# Patient Record
Sex: Male | Born: 1990 | Race: White | Hispanic: No | Marital: Married | Smoking: Never smoker
Health system: Southern US, Community
[De-identification: ages and names within clinical notes are randomized; demographics above are authoritative.]

## PROBLEM LIST (undated history)

## (undated) DIAGNOSIS — N200 Calculus of kidney: Secondary | ICD-10-CM

## (undated) DIAGNOSIS — I10 Essential (primary) hypertension: Secondary | ICD-10-CM

## (undated) HISTORY — DX: Essential (primary) hypertension: I10

---
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
## 2012-02-24 ENCOUNTER — Other Ambulatory Visit: Payer: Self-pay | Admitting: Internal Medicine

## 2012-02-24 DIAGNOSIS — R945 Abnormal results of liver function studies: Secondary | ICD-10-CM

## 2012-02-26 ENCOUNTER — Ambulatory Visit
Admission: RE | Admit: 2012-02-26 | Discharge: 2012-02-26 | Disposition: A | Discharge status: Home / Self Care | Payer: 59 | Source: Ambulatory Visit | Attending: Internal Medicine | Admitting: Internal Medicine

## 2012-02-26 DIAGNOSIS — R945 Abnormal results of liver function studies: Secondary | ICD-10-CM

## 2013-11-07 IMAGING — US US ABDOMEN COMPLETE
1 series · 14 of 25 positions shown · non-contrast
Comparison: None.

CLINICAL DATA: Abnormal LFTs

COMPLETE ABDOMINAL ULTRASOUND

[Series 1: us abdomen complete · 14 of 89 slices shown]
[im 1/89]
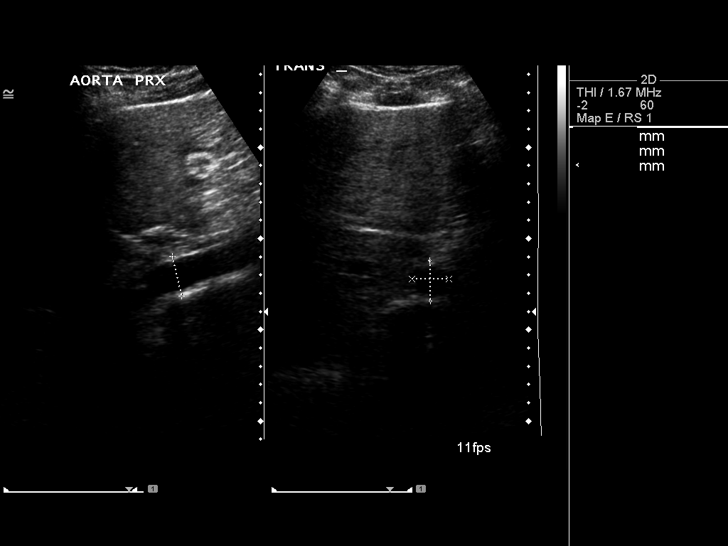
[im 8/89]
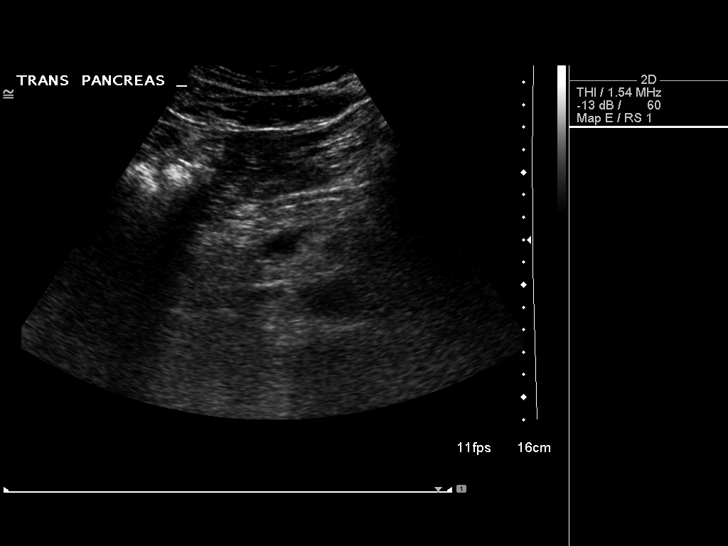
[im 15/89]
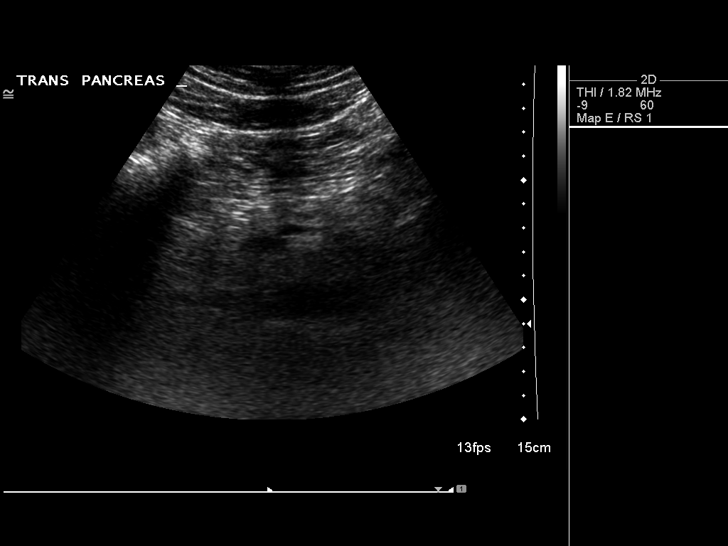
[im 23/89]
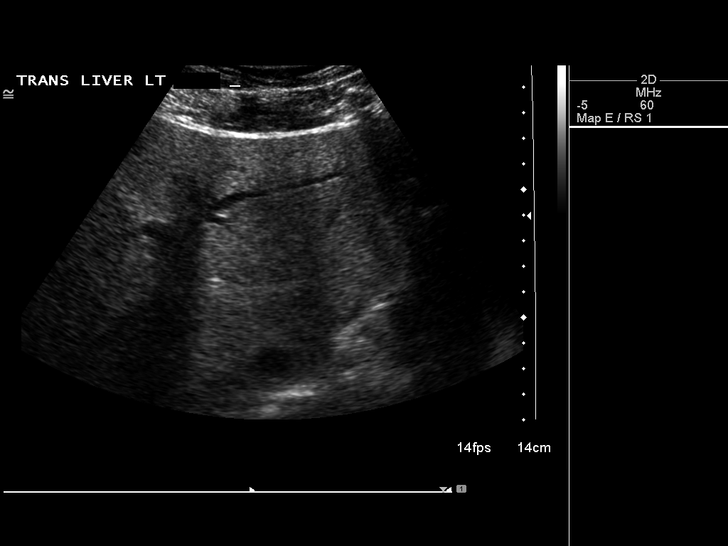
[im 30/89]
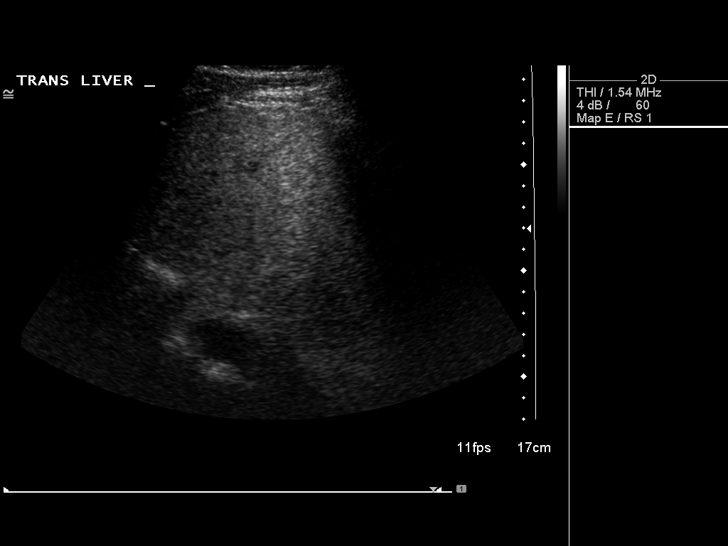
[im 34/89]
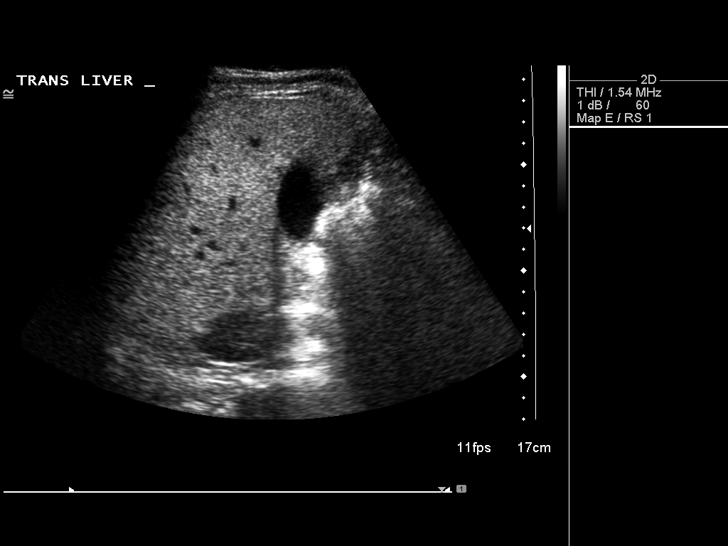
[im 41/89]
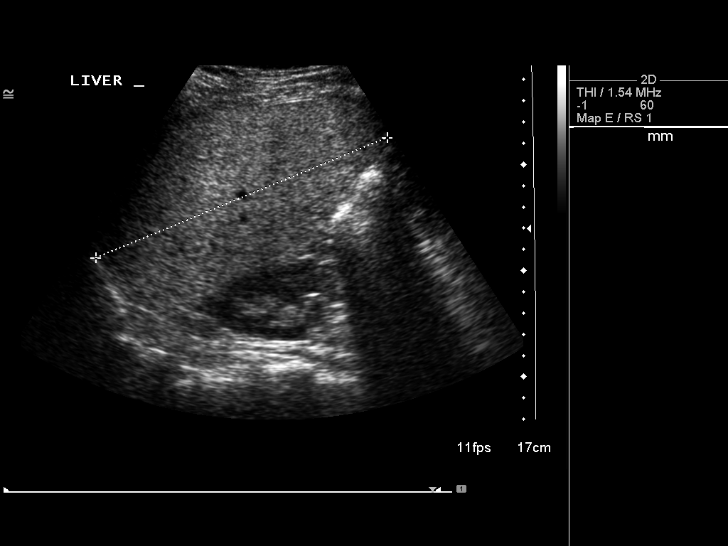
[im 48/89]
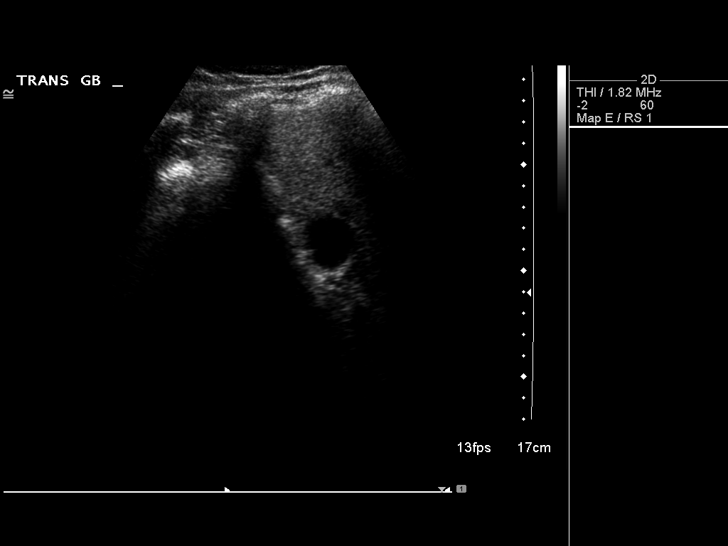
[im 56/89]
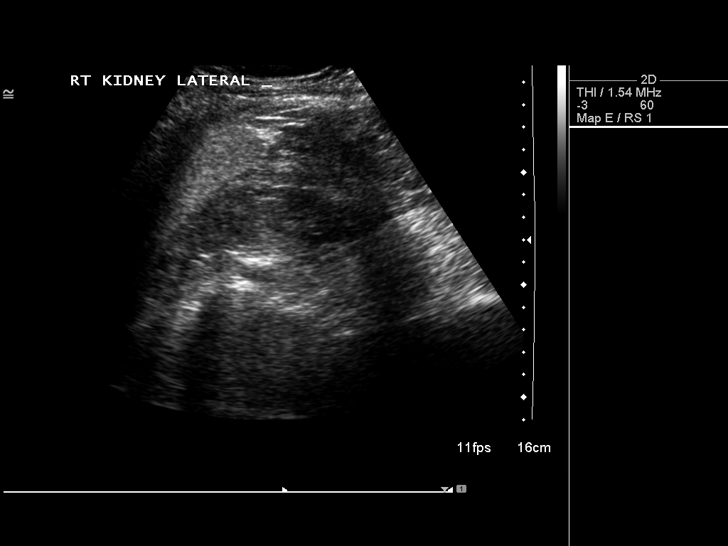
[im 59/89]
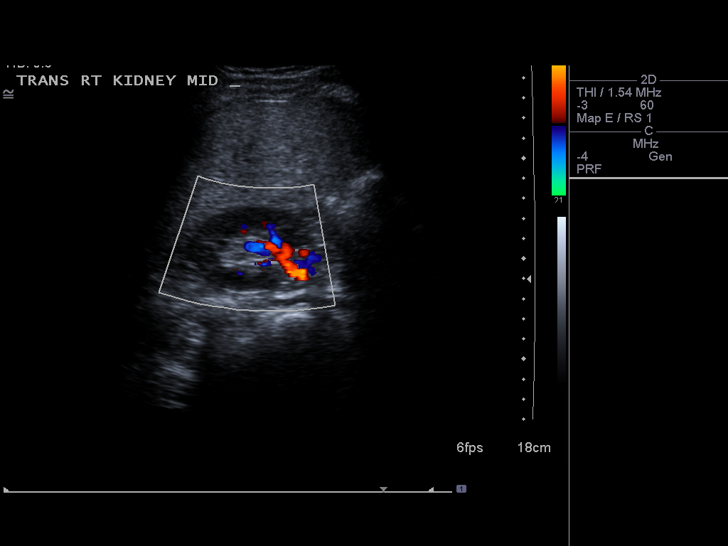
[im 67/89]
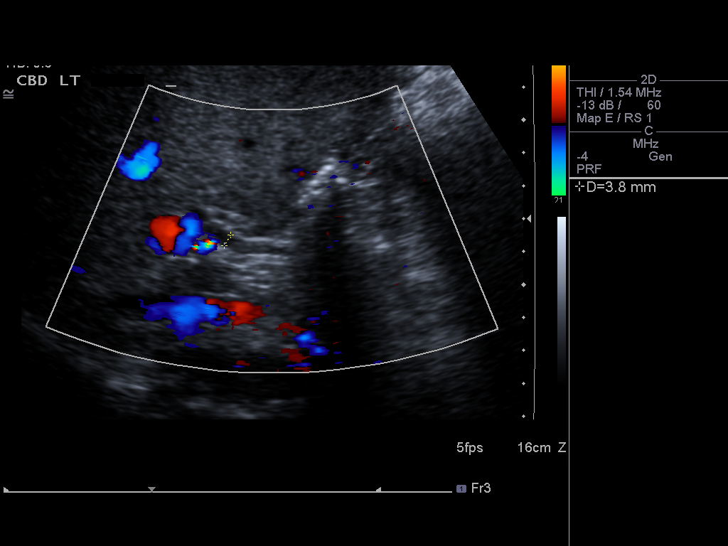
[im 74/89]
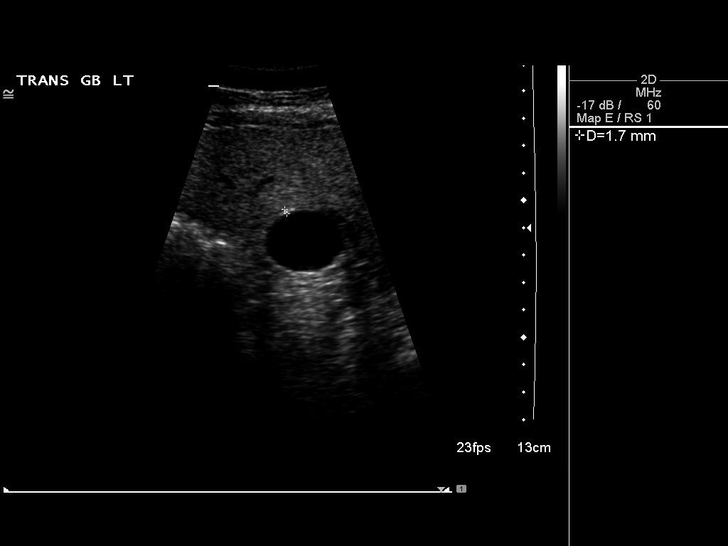
[im 81/89]
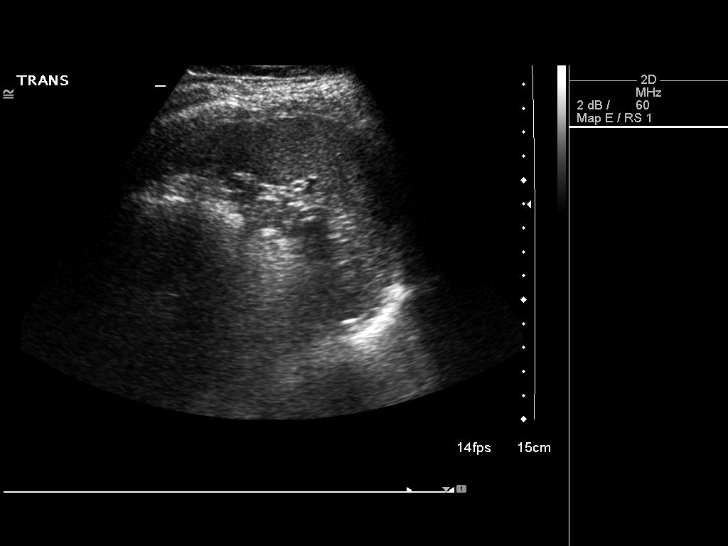
[im 89/89]
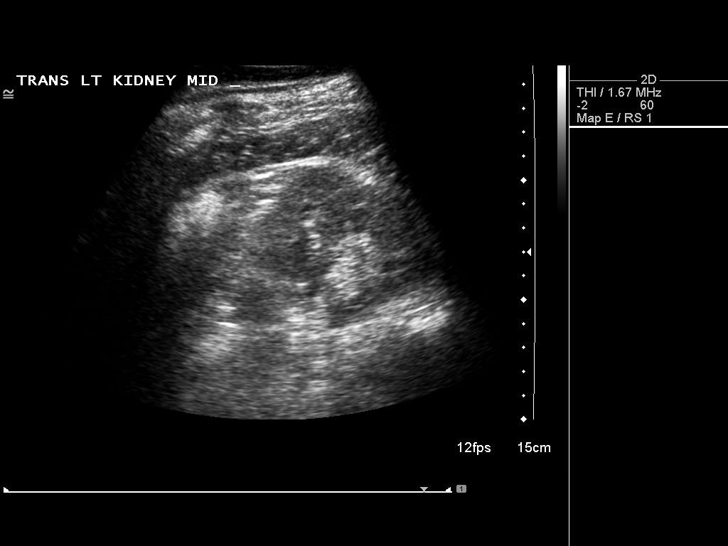

[14 of 25 positions shown; findings below may reference images not displayed]

FINDINGS: Gallbladder:  No shadowing gallstones or echogenic sludge.  No
gallbladder wall thickening or pericholecystic fluid.  Negative
sonographic Murphy's sign according to the ultrasound technologist.

Common bile duct:  3.8 mm diameter, unremarkable

Liver:  Echogenic parenchyma without focal lesion or intrahepatic
biliary ductal dilatation.  Antegrade portal vein flow.

IVC:  Appears normal.

Pancreas:  Unremarkable, portions obscured by overlying bowel gas.

Spleen:  10 cm craniocaudal length, unremarkable.

Right Kidney:  9.8 cm.  10 x 11 mm parapelvic cyst in the upper
pole.  No solid renal lesion or hydronephrosis.

Left Kidney:  10.9 cm. No hydronephrosis.  Well-preserved cortex.
Normal size and parenchymal echotexture without focal
abnormalities.

Abdominal aorta:  No aneurysm identified.
IMPRESSION: 1.  Normal gallbladder.
2. Echogenic liver parenchyma, a nonspecific finding often
associated with fatty infiltration.

## 2015-01-25 ENCOUNTER — Encounter (HOSPITAL_COMMUNITY): Payer: Self-pay | Admitting: *Deleted

## 2015-01-25 ENCOUNTER — Emergency Department (INDEPENDENT_AMBULATORY_CARE_PROVIDER_SITE_OTHER): Payer: 59

## 2015-01-25 ENCOUNTER — Emergency Department (INDEPENDENT_AMBULATORY_CARE_PROVIDER_SITE_OTHER)
Admission: EM | Admit: 2015-01-25 | Discharge: 2015-01-25 | Disposition: A | Discharge status: Home / Self Care | Payer: Self-pay | Source: Home / Self Care | Attending: Family Medicine | Admitting: Family Medicine

## 2015-01-25 DIAGNOSIS — S60222A Contusion of left hand, initial encounter: Secondary | ICD-10-CM
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
## 2015-01-25 NOTE — Discharge Instructions (Signed)
Ice pack for 2-3 days for soreness with advil as needed, return if any problems.
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
## 2020-03-17 ENCOUNTER — Other Ambulatory Visit: Payer: Self-pay

## 2020-03-17 ENCOUNTER — Emergency Department (HOSPITAL_COMMUNITY)
Admission: EM | Admit: 2020-03-17 | Discharge: 2020-03-18 | Disposition: A | Discharge status: Home / Self Care | Payer: BC Managed Care – PPO | Attending: Emergency Medicine | Admitting: Emergency Medicine

## 2020-03-17 ENCOUNTER — Encounter (HOSPITAL_COMMUNITY): Payer: Self-pay | Admitting: Emergency Medicine

## 2020-03-17 DIAGNOSIS — R059 Cough, unspecified: Secondary | ICD-10-CM | POA: Diagnosis present

## 2020-03-17 DIAGNOSIS — U071 COVID-19: Secondary | ICD-10-CM | POA: Diagnosis not present

## 2020-03-17 LAB — RESP PANEL BY RT-PCR (FLU A&B, COVID) ARPGX2
Influenza A by PCR: NEGATIVE
Influenza B by PCR: NEGATIVE
SARS Coronavirus 2 by RT PCR: POSITIVE — AB

## 2020-03-17 NOTE — ED Triage Notes (Addendum)
Pt reports cough, runny nose, and fever that started yesterday.  His job sent him for a COVID test.

## 2020-03-17 NOTE — ED Notes (Signed)
Pt updated on wait for treatment room and moved to COVID + waiting area.

## 2020-03-18 NOTE — ED Provider Notes (Signed)
[facility] Provider Note   CSN: [id number] Arrival date & time: 03/17/20  1125     History Chief Complaint  Patient presents with  . Cough    [name] is a 30 y.o. male.  The history is provided by the patient and medical records.    30 y.o. M here with 24 hours of cough, runny nose, and fever.  Denies sick contacts or known covid exposures.  He has been eating/drinking well, denies vomiting/diarrhea.  Family members without current symptoms.  States work sent him here for covid testing prior to returning.  Fully vaccinated for covid-19.  History reviewed. No pertinent past medical history.  There are no problems to display for this patient.   History reviewed. No pertinent surgical history.     No family history on file.  Social History   Substance Use Topics  . Alcohol use: No    Home Medications Prior to Admission medications   Not on File    Allergies    Patient has no allergy information on record.  Review of Systems   Review of Systems  HENT: Positive for congestion and rhinorrhea.   Respiratory: Positive for cough.   All other systems reviewed and are negative.   Physical Exam Updated Vital Signs BP 108/72 (BP Location: Right Arm)   Pulse 96   Temp 99.9 F (37.7 C) (Oral)   Resp 20   SpO2 99%   Physical Exam Vitals and nursing note reviewed.  Constitutional:      Appearance: He is well-developed and well-nourished.  HENT:     Head: Normocephalic and atraumatic.     Mouth/Throat:     Mouth: Oropharynx is clear and moist.  Eyes:     Extraocular Movements: EOM normal.     Conjunctiva/sclera: Conjunctivae normal.     Pupils: Pupils are equal, round, and reactive to light.  Cardiovascular:     Rate and Rhythm: Normal rate and regular rhythm.     Heart sounds: Normal heart sounds.  Pulmonary:     Effort: Pulmonary effort is normal.     Breath sounds: Normal breath sounds.     Comments: Dry  cough observed, NAD, speaking in full sentences with fluid conversation, O2 sats stable on RA Abdominal:     General: Bowel sounds are normal.     Palpations: Abdomen is soft.  Musculoskeletal:        General: Normal range of motion.     Cervical back: Normal range of motion.  Skin:    General: Skin is warm and dry.  Neurological:     Mental Status: He is alert and oriented to person, place, and time.  Psychiatric:        Mood and Affect: Mood and affect normal.     ED Results / Procedures / Treatments   Labs (all labs ordered are listed, but only abnormal results are displayed) Labs Reviewed  RESP PANEL BY RT-PCR (FLU A&B, COVID) ARPGX2 - Abnormal; Notable for the following components:      Result Value   SARS Coronavirus 2 by RT PCR POSITIVE (*)    All other components within normal limits    EKG None  Radiology No results found.  Procedures Procedures (including critical care time)  Medications Ordered in ED Medications - No data to display  ED Course  I have reviewed the triage vital signs and the nursing notes.  Pertinent labs & imaging results that were available during  my care of the patient were reviewed by me and considered in my medical decision making (see chart for details).    MDM Rules/Calculators/A&P  30 y.o. M here with URI symptoms x24 hours.  No sick contacts or covid exposures.  He is fully vaccinated.  He is afebrile, non-toxic, symptoms appear mild.  No acute respiratory distress noted, vitals stable on RA.  PCR is positive.  Informed of results, states he has been told to stay out of work for 1 week by HR, work note provided along with copy of positive result.  Encouraged rest, oral hydration, tylenol for fever.  Information for local testing centers given for family testing if desired.  Return here for new concerns.  Final Clinical Impression(s) / ED Diagnoses Final diagnoses:  COVID-19    Rx / DC Orders ED Discharge Orders    None        Garlon Hatchet, PA-C 03/18/20 0351    Palumbo, April, MD 03/18/20 [id number]

## 2020-03-18 NOTE — Discharge Instructions (Signed)
Covid test today was positive. Try to quarantine away from others best you can. If desiring testing for family, can use the [facility]. Appointments can be made at [email or web address] or by calling [phone]   Rest, stay hydrated, tylenol as needed for fever.

## 2020-06-11 ENCOUNTER — Emergency Department (HOSPITAL_COMMUNITY)
Admission: EM | Admit: 2020-06-11 | Discharge: 2020-06-12 | Disposition: A | Discharge status: Home / Self Care | Payer: BC Managed Care – PPO | Attending: Emergency Medicine | Admitting: Emergency Medicine

## 2020-06-11 ENCOUNTER — Other Ambulatory Visit: Payer: Self-pay

## 2020-06-11 ENCOUNTER — Encounter (HOSPITAL_COMMUNITY): Payer: Self-pay

## 2020-06-11 ENCOUNTER — Emergency Department (HOSPITAL_COMMUNITY): Payer: BC Managed Care – PPO

## 2020-06-11 DIAGNOSIS — R519 Headache, unspecified: Secondary | ICD-10-CM | POA: Insufficient documentation

## 2020-06-11 DIAGNOSIS — R42 Dizziness and giddiness: Secondary | ICD-10-CM | POA: Insufficient documentation

## 2020-06-11 DIAGNOSIS — R112 Nausea with vomiting, unspecified: Secondary | ICD-10-CM | POA: Diagnosis not present

## 2020-06-11 MED ORDER — MECLIZINE HCL 25 MG PO TABS
25.0000 mg | ORAL_TABLET | Freq: Once | ORAL | Status: AC
  Administered 2020-06-11: 25 mg via ORAL
  Filled 2020-06-11: qty 1

## 2020-06-11 NOTE — ED Provider Notes (Addendum)
MSE was initiated and I personally evaluated the patient and placed orders (if any) at  10:16 PM on June 11, 2020.  Patient presents with gradual onset HA, circumferential, and room spinning dizziness that started at the same time. Go home, ended up falling secondary to dizziness/off balance - no LOC. Naurea with vomiting. Reports fever at home, Tmax 102.3. Has not taken anything for fever - 99.3 on arrival. No history of HA's or vertigo.   No PMH. No medications.   Exam +lateral nystagmus, no other neuro findings. No meningeal signs     The patient appears stable so that the remainder of the MSE may be completed by another provider.   Elpidio Anis, PA-C 06/11/20 2222    Elpidio Anis, PA-C 06/11/20 2224    Alvira Monday, MD 06/12/20 1210

## 2020-06-11 NOTE — ED Triage Notes (Addendum)
Woke up today with dizziness (the room was spinning) and headache with nausea and vomiting. Fever at home with 102.3. neck pain upon movement.

## 2020-06-12 MED ORDER — MECLIZINE HCL 25 MG PO TABS: 25.0000 mg | ORAL_TABLET | Freq: Three times a day (TID) | ORAL | 0 refills | Status: DC | PRN

## 2020-06-12 MED ORDER — ONDANSETRON 4 MG PO TBDP: 4.0000 mg | ORAL_TABLET | Freq: Three times a day (TID) | ORAL | 0 refills | Status: DC | PRN

## 2020-06-12 NOTE — ED Provider Notes (Signed)
[facility] EMERGENCY DEPARTMENT Provider Note   CSN: [id number] Arrival date & time: 06/11/20  2154     History Chief Complaint  Patient presents with  . Dizziness  . Headache    [facility] is a 30 y.o. male.  Patient to ED for evaluation of gradual onset headache associated with room spinning dizziness that started yesterday evening (4/5). He reports having a fever at home by temporal thermometer of 102.3. He developed nausea with vomiting. The dizziness is worse with movement or change in position. No previous history of headaches or vertigo, but he reports his father had vertigo with debilitating symptoms. No head injury, visual changes, SOB, weakness.  The history is provided by the patient. A language interpreter was used.  Dizziness Associated symptoms: headaches, nausea and vomiting   Associated symptoms: no chest pain, no shortness of breath and no weakness   Headache Associated symptoms: dizziness, fever (See HPI.), nausea and vomiting   Associated symptoms: no abdominal pain, no congestion, no cough, no myalgias, no numbness, no sore throat and no weakness        History reviewed. No pertinent past medical history.  There are no problems to display for this patient.   History reviewed. No pertinent surgical history.     No family history on file.  Social History   Tobacco Use  . Smoking status: Never Smoker  . Smokeless tobacco: Never Used  Substance Use Topics  . Alcohol use: No  . Drug use: Never    Home Medications Prior to Admission medications   Not on File    Allergies    Patient has no known allergies.  Review of Systems   Review of Systems  Constitutional: Positive for fever (See HPI.). Negative for chills.  HENT: Negative.  Negative for congestion and sore throat.   Eyes: Negative for visual disturbance.  Respiratory: Negative.  Negative for cough and shortness of breath.   Cardiovascular: Negative.  Negative  for chest pain.  Gastrointestinal: Positive for nausea and vomiting. Negative for abdominal pain.  Musculoskeletal: Negative.  Negative for myalgias.  Skin: Negative.  Negative for color change.  Neurological: Positive for dizziness and headaches. Negative for syncope, weakness and numbness.    Physical Exam Updated Vital Signs BP 126/85   Pulse 95   Temp 98.8 F (37.1 C) (Oral)   Resp 16   Ht 5\' 5"  (1.651 m)   Wt 88.5 kg   SpO2 98%   BMI 32.45 kg/m   Physical Exam Constitutional:      Appearance: He is well-developed.  HENT:     Head: Normocephalic.  Cardiovascular:     Rate and Rhythm: Normal rate.  Pulmonary:     Effort: Pulmonary effort is normal.  Abdominal:     Palpations: Abdomen is soft.     Tenderness: There is no abdominal tenderness. There is no rebound.  Musculoskeletal:        General: Normal range of motion.     Cervical back: Normal range of motion and neck supple.  Skin:    General: Skin is warm and dry.  Neurological:     Mental Status: He is alert.     GCS: GCS eye subscore is 4. GCS verbal subscore is 5. GCS motor subscore is 6.     Cranial Nerves: No dysarthria.     Sensory: No sensory deficit.     Motor: No weakness.     Coordination: Coordination normal.  Comments: CN's 3-12 grossly intact. Speech is clear and focused. No facial asymmetry. No lateralizing weakness. No deficits of coordination. There is lateral nystagmus present.    Psychiatric:        Mood and Affect: Mood normal.     ED Results / Procedures / Treatments   Labs (all labs ordered are listed, but only abnormal results are displayed) Labs Reviewed - No data to display  EKG None  Radiology CT Head Wo Contrast  Result Date: 06/11/2020 CLINICAL DATA:  Dizziness EXAM: CT HEAD WITHOUT CONTRAST TECHNIQUE: Contiguous axial images were obtained from the base of the skull through the vertex without intravenous contrast. COMPARISON:  None. FINDINGS: Brain: No evidence of acute  territorial infarction, hemorrhage, hydrocephalus,extra-axial collection or mass lesion/mass effect. Normal gray-white differentiation. Ventricles are normal in size and contour. Vascular: No hyperdense vessel or unexpected calcification. Skull: The skull is intact. No fracture or focal lesion identified. Sinuses/Orbits: The visualized paranasal sinuses and mastoid air cells are clear. The orbits and globes intact. Other: None IMPRESSION: No acute intracranial abnormality. Electronically Signed   By: Jonna Clark M.D.   On: 06/11/2020 23:06    Procedures Procedures   Medications Ordered in ED Medications  meclizine (ANTIVERT) tablet 25 mg (25 mg Oral Given 06/11/20 2232)    ED Course  I have reviewed the triage vital signs and the nursing notes.  Pertinent labs & imaging results that were available during my care of the patient were reviewed by me and considered in my medical decision making (see chart for details).    MDM Rules/Calculators/A&P                          Patient to ED with ss/sxs as per HPI. 30 yo otherwise healthy patient.   Symptoms and exam c/w vertigo. No history of same, no history of HA's. CT head ordered and is negative.   He reports improvement with Meclizine provided in ED. Still dizzy, no headache, but dizziness improved. He is considered appropriate for discharge home. Return precautions discussed.   Final Clinical Impression(s) / ED Diagnoses Final diagnoses:  None   1. Vertigo  Rx / DC Orders ED Discharge Orders    None       Elpidio Anis, PA-C 06/12/20 0106    Geoffery Lyons, MD 06/12/20 [phone]

## 2020-06-12 NOTE — Discharge Instructions (Signed)
Take the medications as prescribed. Your dizziness should improve over the course of the next several days.   If you become worse, start having a severe headache, uncontrolled vomiting, confirmed high fever, please return to the emergency department for further evaluation.

## 2020-11-12 ENCOUNTER — Other Ambulatory Visit: Payer: Self-pay

## 2020-11-12 ENCOUNTER — Ambulatory Visit
Admission: EM | Admit: 2020-11-12 | Discharge: 2020-11-12 | Disposition: A | Discharge status: Home / Self Care | Payer: BC Managed Care – PPO | Attending: Internal Medicine | Admitting: Internal Medicine

## 2020-11-12 DIAGNOSIS — H109 Unspecified conjunctivitis: Secondary | ICD-10-CM

## 2020-11-12 DIAGNOSIS — H5711 Ocular pain, right eye: Secondary | ICD-10-CM

## 2020-11-12 MED ORDER — OFLOXACIN 0.3 % OP SOLN: 1.0000 [drp] | Freq: Four times a day (QID) | OPHTHALMIC | 0 refills | Status: AC

## 2020-11-12 NOTE — Discharge Instructions (Addendum)
You have been prescribed ofloxacin eyedrops to treat eye infection.  Please follow-up with eye doctor if symptoms do not improve in the next 48 hours or if symptoms worsen.

## 2020-11-12 NOTE — ED Triage Notes (Signed)
4 day h/o right eye redness and drainage. Pt reports sxs appeared to be getting better then began to increase without cause. Describes pain as "sand in it". Has been using OTC pink eye drops without a decrease in sxs.

## 2020-11-12 NOTE — ED Provider Notes (Signed)
[facility]    CSN: [id number] Arrival date & time: 11/12/20  0817      History   Chief Complaint Chief Complaint  Patient presents with   eye redness    right    HPI [facility] is a 30 y.o. male.   Patient presents with 4-day history of right eye redness, swelling, purulent drainage, itchiness.  Denies getting anything in his eye or any injury to the eye.  Patient does wear contacts but has not been wearing them since symptoms started.  States that it feels like he has "sand in it".  Has been using over-the-counter pinkeye drops with no resolution in symptoms.  Does have some mild blurry vision.  Denies any fevers or any recent upper respiratory symptoms.    History reviewed. No pertinent past medical history.  There are no problems to display for this patient.   History reviewed. No pertinent surgical history.     Home Medications    Prior to Admission medications   Medication Sig Start Date End Date Taking? Authorizing Provider  ofloxacin (OCUFLOX) 0.3 % ophthalmic solution Place 1 drop into the right eye 4 (four) times daily for 7 days. 11/12/20 11/19/20 Yes Lance Muss, FNP  meclizine (ANTIVERT) 25 MG tablet Take 1 tablet (25 mg total) by mouth 3 (three) times daily as needed for dizziness. 06/12/20   Elpidio Anis, PA-C  ondansetron (ZOFRAN ODT) 4 MG disintegrating tablet Take 1 tablet (4 mg total) by mouth every 8 (eight) hours as needed for nausea or vomiting. 06/12/20   Elpidio Anis, PA-C    Family History History reviewed. No pertinent family history.  Social History Social History   Tobacco Use   Smoking status: Never   Smokeless tobacco: Never  Substance Use Topics   Alcohol use: No   Drug use: Never     Allergies   Patient has no known allergies.   Review of Systems Review of Systems Per HPI  Physical Exam Triage Vital Signs ED Triage Vitals  Enc Vitals Group     BP 11/12/20 0827 123/85     Pulse Rate 11/12/20 0827  88     Resp 11/12/20 0827 18     Temp 11/12/20 0827 98.2 F (36.8 C)     Temp Source 11/12/20 0827 Oral     SpO2 11/12/20 0827 97 %     Weight --      Height --      Head Circumference --      Peak Flow --      Pain Score 11/12/20 0829 5     Pain Loc --      Pain Edu? --      Excl. in GC? --    No data found.  Updated Vital Signs BP 123/85 (BP Location: Right Arm)   Pulse 88   Temp 98.2 F (36.8 C) (Oral)   Resp 18   SpO2 97%   Visual Acuity Right Eye Distance: 20/20 Left Eye Distance: 20/30 Bilateral Distance: 20/15 (pt wearing glasses during exam)  Right Eye Near:   Left Eye Near:    Bilateral Near:     Physical Exam Constitutional:      Appearance: Normal appearance.  HENT:     Head: Normocephalic and atraumatic.     Right Ear: Tympanic membrane and ear canal normal.     Left Ear: Tympanic membrane and ear canal normal.     Nose: Nose normal.  Mouth/Throat:     Lips: Pink.     Mouth: Mucous membranes are moist.     Pharynx: Oropharynx is clear. No posterior oropharyngeal erythema.  Eyes:     General: Lids are normal. Lids are everted, no foreign bodies appreciated.        Right eye: Discharge present. No foreign body or hordeolum.     Extraocular Movements: Extraocular movements intact.     Conjunctiva/sclera:     Right eye: Right conjunctiva is injected. Exudate present. No chemosis or hemorrhage.    Comments: Scleral redness present to right eye.  Pulmonary:     Effort: Pulmonary effort is normal.  Neurological:     General: No focal deficit present.     Mental Status: He is alert and oriented to person, place, and time. Mental status is at baseline.  Psychiatric:        Mood and Affect: Mood normal.        Behavior: Behavior normal.        Thought Content: Thought content normal.        Judgment: Judgment normal.     UC Treatments / Results  Labs (all labs ordered are listed, but only abnormal results are displayed) Labs Reviewed - No data  to display  EKG   Radiology No results found.  Procedures Procedures (including critical care time)  Medications Ordered in UC Medications - No data to display  Initial Impression / Assessment and Plan / UC Course  I have reviewed the triage vital signs and the nursing notes.  Pertinent labs & imaging results that were available during my care of the patient were reviewed by me and considered in my medical decision making (see chart for details).     Physical exam is consistent with right bacterial conjunctivitis.  Will treat with ofloxacin due to patient being a contact lens wear.  Visual acuity fairly normal.  No red flags at this time.  Advised patient to follow-up with ophthalmologist if no improvement in symptoms in the next 24 to 48 hours due to patient being a contact lens wearer.  Patient voiced understanding and was agreeable with plan.Discussed strict return precautions. Patient verbalized understanding and is agreeable with plan.  Final Clinical Impressions(s) / UC Diagnoses   Final diagnoses:  Bacterial conjunctivitis of right eye  Discomfort of right eye     Discharge Instructions      You have been prescribed ofloxacin eyedrops to treat eye infection.  Please follow-up with eye doctor if symptoms do not improve in the next 48 hours or if symptoms worsen.     ED Prescriptions     Medication Sig Dispense Auth. Provider   ofloxacin (OCUFLOX) 0.3 % ophthalmic solution Place 1 drop into the right eye 4 (four) times daily for 7 days. 1.4 mL Lance Muss, FNP      PDMP not reviewed this encounter.   Lance Muss, FNP 11/12/20 [phone]

## 2023-04-20 NOTE — Progress Notes (Unsigned)
    New patient visit   Patient: Ruben Sweeney   DOB: Oct 29, 1990   33 y.o. Male  MRN: [id number] Visit Date: 04/21/2023  Today's healthcare provider: Alfredia Ferguson, PA-C   No chief complaint on file.  Subjective    HONORIO DEVOL is a 33 y.o. male who presents today as a new patient to establish care.   ***  No past medical history on file. No past surgical history on file. No family status information on file.   No family history on file. Social History   Socioeconomic History   Marital status: Married    Spouse name: Not on file   Number of children: Not on file   Years of education: Not on file   Highest education level: Not on file  Occupational History   Not on file  Tobacco Use   Smoking status: Never   Smokeless tobacco: Never  Substance and Sexual Activity   Alcohol use: No   Drug use: Never   Sexual activity: Not on file  Other Topics Concern   Not on file  Social History Narrative   Not on file   Social Drivers of Health   Financial Resource Strain: Not on file  Food Insecurity: Not on file  Transportation Needs: Not on file  Physical Activity: Not on file  Stress: Not on file  Social Connections: Not on file   Outpatient Medications Prior to Visit  Medication Sig   meclizine (ANTIVERT) 25 MG tablet Take 1 tablet (25 mg total) by mouth 3 (three) times daily as needed for dizziness.   ondansetron (ZOFRAN ODT) 4 MG disintegrating tablet Take 1 tablet (4 mg total) by mouth every 8 (eight) hours as needed for nausea or vomiting.   No facility-administered medications prior to visit.   No Known Allergies   There is no immunization history on file for this patient.  Health Maintenance  Topic Date Due   HIV Screening  Never done   Hepatitis C Screening  Never done   DTaP/Tdap/Td (1 - Tdap) Never done   INFLUENZA VACCINE  Never done   COVID-19 Vaccine (1 - 2024-25 season) Never done   HPV VACCINES  Aged Out    Patient Care  Team: Patient, No Pcp Per as PCP - General (General Practice)  Review of Systems  {Insert previous labs (optional):23779} {See past labs  Heme  Chem  Endocrine  Serology  Results Review (optional):1}   Objective    There were no vitals taken for this visit. {Insert last BP/Wt (optional):23777}{See vitals history (optional):1}   Physical Exam ***  Depression Screen     No data to display         No results found for any visits on 04/21/23.  Assessment & Plan     There are no diagnoses linked to this encounter.   No follow-ups on file.      Alfredia Ferguson, PA-C  [facility] Primary [facility] at [facility] [phone] (phone) [phone] (fax)  [facility]

## 2023-04-21 ENCOUNTER — Ambulatory Visit: Payer: Medicaid Other | Admitting: Physician Assistant

## 2023-04-21 ENCOUNTER — Other Ambulatory Visit (HOSPITAL_BASED_OUTPATIENT_CLINIC_OR_DEPARTMENT_OTHER): Payer: Self-pay

## 2023-04-21 ENCOUNTER — Encounter: Payer: Self-pay | Admitting: Physician Assistant

## 2023-04-21 VITALS — BP 127/83 | HR 66 | Temp 98.1°F | Ht 65.5 in | Wt 199.4 lb

## 2023-04-21 DIAGNOSIS — Z13220 Encounter for screening for lipoid disorders: Secondary | ICD-10-CM

## 2023-04-21 DIAGNOSIS — R1013 Epigastric pain: Secondary | ICD-10-CM

## 2023-04-21 DIAGNOSIS — Z1159 Encounter for screening for other viral diseases: Secondary | ICD-10-CM | POA: Diagnosis not present

## 2023-04-21 DIAGNOSIS — K219 Gastro-esophageal reflux disease without esophagitis: Secondary | ICD-10-CM | POA: Diagnosis not present

## 2023-04-21 DIAGNOSIS — R519 Headache, unspecified: Secondary | ICD-10-CM | POA: Diagnosis not present

## 2023-04-21 DIAGNOSIS — G8929 Other chronic pain: Secondary | ICD-10-CM | POA: Diagnosis not present

## 2023-04-21 DIAGNOSIS — Z23 Encounter for immunization: Secondary | ICD-10-CM

## 2023-04-21 DIAGNOSIS — R200 Anesthesia of skin: Secondary | ICD-10-CM | POA: Diagnosis not present

## 2023-04-21 DIAGNOSIS — Z114 Encounter for screening for human immunodeficiency virus [HIV]: Secondary | ICD-10-CM

## 2023-04-21 LAB — VITAMIN B12: Vitamin B-12: 131 pg/mL — ABNORMAL LOW (ref 211–911)

## 2023-04-21 LAB — COMPREHENSIVE METABOLIC PANEL
ALT: 61 U/L — ABNORMAL HIGH (ref 0–53)
AST: 25 U/L (ref 0–37)
Albumin: 4.7 g/dL (ref 3.5–5.2)
Alkaline Phosphatase: 71 U/L (ref 39–117)
BUN: 12 mg/dL (ref 6–23)
CO2: 29 meq/L (ref 19–32)
Calcium: 9.6 mg/dL (ref 8.4–10.5)
Chloride: 102 meq/L (ref 96–112)
Creatinine, Ser: 0.82 mg/dL (ref 0.40–1.50)
GFR: 115.16 mL/min (ref >60.00)
Glucose, Bld: 90 mg/dL (ref 70–99)
Potassium: 4.5 meq/L (ref 3.5–5.1)
Sodium: 139 meq/L (ref 135–145)
Total Bilirubin: 0.8 mg/dL (ref 0.2–1.2)
Total Protein: 7.4 g/dL (ref 6.0–8.3)

## 2023-04-21 LAB — LIPID PANEL
Cholesterol: 175 mg/dL (ref 0–200)
HDL: 36.4 mg/dL — ABNORMAL LOW (ref >39.00)
LDL Cholesterol: 100 mg/dL — ABNORMAL HIGH (ref 0–99)
NonHDL: 138.14
Total CHOL/HDL Ratio: 5
Triglycerides: 193 mg/dL — ABNORMAL HIGH (ref 0.0–149.0)
VLDL: 38.6 mg/dL (ref 0.0–40.0)

## 2023-04-21 LAB — CBC WITH DIFFERENTIAL/PLATELET
Basophils Absolute: 0 10*3/uL (ref 0.0–0.1)
Basophils Relative: 0.4 % (ref 0.0–3.0)
Eosinophils Absolute: 0.1 10*3/uL (ref 0.0–0.7)
Eosinophils Relative: 0.8 % (ref 0.0–5.0)
HCT: 46.4 % (ref 39.0–52.0)
Hemoglobin: 15.8 g/dL (ref 13.0–17.0)
Lymphocytes Relative: 43.4 % (ref 12.0–46.0)
Lymphs Abs: 3.8 10*3/uL (ref 0.7–4.0)
MCHC: 33.9 g/dL (ref 30.0–36.0)
MCV: 95.4 fL (ref 78.0–100.0)
Monocytes Absolute: 0.5 10*3/uL (ref 0.1–1.0)
Monocytes Relative: 5.3 % (ref 3.0–12.0)
Neutro Abs: 4.3 10*3/uL (ref 1.4–7.7)
Neutrophils Relative %: 50.1 % (ref 43.0–77.0)
Platelets: 280 10*3/uL (ref 150.0–400.0)
RBC: 4.86 Mil/uL (ref 4.22–5.81)
RDW: 13.4 % (ref 11.5–15.5)
WBC: 8.7 10*3/uL (ref 4.0–10.5)

## 2023-04-21 LAB — HEMOGLOBIN A1C: Hgb A1c MFr Bld: 5.6 % (ref 4.6–6.5)

## 2023-04-21 MED ORDER — PANTOPRAZOLE SODIUM 40 MG PO TBEC
40.0000 mg | DELAYED_RELEASE_TABLET | Freq: Every day | ORAL | 1 refills | Status: DC
  Filled 2023-04-21: qty 90, 90d supply, fill #0
  Filled 2023-07-18: qty 90, 90d supply, fill #1

## 2023-04-21 NOTE — Assessment & Plan Note (Signed)
Will check labs, vit b12 No lower extremity numbness If no etiology becomes apparent, given headaches and light sensitivity, consider brain MRI or neuro ref

## 2023-04-21 NOTE — Assessment & Plan Note (Signed)
-   Order H. pylori breath test - Prescribe pantoprazole 40 mg daily in AM - Order gallbladder ultrasound - Advise continued use of Tums and Pepcid as needed

## 2023-04-21 NOTE — Assessment & Plan Note (Signed)
Right-sided headaches occurring once a week, associated with photophobia and tinnitus. Symptoms relieved by wearing sunglasses. Discussed identifying triggers. Will check routine labs. Suggested following up with optometry.

## 2023-04-22 ENCOUNTER — Ambulatory Visit (HOSPITAL_BASED_OUTPATIENT_CLINIC_OR_DEPARTMENT_OTHER)
Admission: RE | Admit: 2023-04-22 | Discharge: 2023-04-22 | Disposition: A | Discharge status: Home / Self Care | Payer: Medicaid Other | Source: Ambulatory Visit | Attending: Physician Assistant | Admitting: Physician Assistant

## 2023-04-22 ENCOUNTER — Encounter: Payer: Self-pay | Admitting: Physician Assistant

## 2023-04-22 ENCOUNTER — Other Ambulatory Visit: Payer: Self-pay | Admitting: Physician Assistant

## 2023-04-22 DIAGNOSIS — R1013 Epigastric pain: Secondary | ICD-10-CM | POA: Diagnosis not present

## 2023-04-22 DIAGNOSIS — K219 Gastro-esophageal reflux disease without esophagitis: Secondary | ICD-10-CM | POA: Diagnosis not present

## 2023-04-22 DIAGNOSIS — R748 Abnormal levels of other serum enzymes: Secondary | ICD-10-CM

## 2023-04-22 DIAGNOSIS — E538 Deficiency of other specified B group vitamins: Secondary | ICD-10-CM

## 2023-04-22 DIAGNOSIS — K760 Fatty (change of) liver, not elsewhere classified: Secondary | ICD-10-CM | POA: Insufficient documentation

## 2023-04-22 LAB — HIV ANTIBODY (ROUTINE TESTING W REFLEX): HIV 1&2 Ab, 4th Generation: NONREACTIVE

## 2023-04-22 LAB — HEPATITIS C ANTIBODY: Hepatitis C Ab: NONREACTIVE

## 2023-04-23 ENCOUNTER — Other Ambulatory Visit: Payer: Self-pay | Admitting: Physician Assistant

## 2023-04-23 ENCOUNTER — Other Ambulatory Visit (HOSPITAL_BASED_OUTPATIENT_CLINIC_OR_DEPARTMENT_OTHER): Payer: Self-pay

## 2023-04-23 DIAGNOSIS — E538 Deficiency of other specified B group vitamins: Secondary | ICD-10-CM

## 2023-04-23 LAB — H. PYLORI BREATH TEST: H. pylori Breath Test: NOT DETECTED

## 2023-04-23 MED ORDER — VITAMIN B-12 1000 MCG PO TABS
1000.0000 ug | ORAL_TABLET | Freq: Every day | ORAL | 1 refills | Status: DC
  Filled 2023-04-23: qty 100, 100d supply, fill #0
  Filled 2023-07-25 – 2023-08-19 (×2): qty 80, 80d supply, fill #1

## 2023-04-25 ENCOUNTER — Encounter: Payer: Self-pay | Admitting: Physician Assistant

## 2023-04-27 ENCOUNTER — Ambulatory Visit: Payer: Medicaid Other | Admitting: Physician Assistant

## 2023-04-27 ENCOUNTER — Other Ambulatory Visit (HOSPITAL_BASED_OUTPATIENT_CLINIC_OR_DEPARTMENT_OTHER): Payer: Self-pay

## 2023-04-27 VITALS — BP 110/79 | HR 68 | Temp 97.9°F | Ht 65.5 in | Wt 199.1 lb

## 2023-04-27 DIAGNOSIS — L84 Corns and callosities: Secondary | ICD-10-CM

## 2023-04-27 DIAGNOSIS — L853 Xerosis cutis: Secondary | ICD-10-CM

## 2023-04-27 MED ORDER — UREA 40 % EX CREA
TOPICAL_CREAM | CUTANEOUS | 1 refills | Status: DC
  Filled 2023-04-27 (×2): qty 28.35, 30d supply, fill #0
  Filled 2023-07-26: qty 85, 30d supply, fill #1
  Filled 2023-09-14: qty 85, 30d supply, fill #2

## 2023-04-27 NOTE — Progress Notes (Signed)
      Established patient visit   Patient: Ruben Sweeney   DOB: 04-13-1990   33 y.o. Male  MRN: [id number] Visit Date: 04/27/2023  Today's healthcare provider: Alfredia Ferguson, PA-C   Chief Complaint  Patient presents with   Eczema    Patient complains of feet always staying dry. No OTC lotion has worked. States they constantly dry. States sometimes the dryness is painful.    Subjective     Pt reports dry, cracked, painful feet, especially at his heels. He has tried otc creams and lotions with no improvement  Medications: Outpatient Medications Prior to Visit  Medication Sig   cyanocobalamin (VITAMIN B12) 1000 MCG tablet Take 1 tablet (1,000 mcg total) by mouth daily.   pantoprazole (PROTONIX) 40 MG tablet Take 1 tablet (40 mg total) by mouth daily. Take first thing in the morning and wait 20 minutes before eating any food   No facility-administered medications prior to visit.    Review of Systems  Constitutional:  Negative for fatigue and fever.  Respiratory:  Negative for cough and shortness of breath.   Cardiovascular:  Negative for chest pain, palpitations and leg swelling.  Neurological:  Negative for dizziness and headaches.       Objective    BP 110/79   Pulse 68   Temp 97.9 F (36.6 C) (Oral)   Ht 5' 5.5" (1.664 m)   Wt 199 lb 2 oz (90.3 kg)   SpO2 96%   BMI 32.63 kg/m    Physical Exam Vitals reviewed.  Constitutional:      Appearance: He is not ill-appearing.  HENT:     Head: Normocephalic.  Eyes:     Conjunctiva/sclera: Conjunctivae normal.  Cardiovascular:     Rate and Rhythm: Normal rate.  Pulmonary:     Effort: Pulmonary effort is normal. No respiratory distress.  Skin:    Comments: Thick, callous, dry cracking skin on bottom of feet/heels  Neurological:     General: No focal deficit present.     Mental Status: He is alert and oriented to person, place, and time.  Psychiatric:        Mood and Affect: Mood normal.        Behavior:  Behavior normal.      No results found for any visits on 04/27/23.  Assessment & Plan    Corns and callosities -     Urea; Apply to both feet twice daily  Dispense: 85 g; Refill: 1  Xerosis cutis -     Urea; Apply to both feet twice daily  Dispense: 85 g; Refill: 1   Recommending 40% urea cream.   Return if symptoms worsen or fail to improve.       Alfredia Ferguson, PA-C  [facility] Primary Care at [facility] [phone] (phone) [phone] (fax)  [facility]

## 2023-04-28 ENCOUNTER — Other Ambulatory Visit (HOSPITAL_BASED_OUTPATIENT_CLINIC_OR_DEPARTMENT_OTHER): Payer: Self-pay

## 2023-05-08 ENCOUNTER — Encounter: Payer: Self-pay | Admitting: Physician Assistant

## 2023-07-19 ENCOUNTER — Other Ambulatory Visit (HOSPITAL_COMMUNITY): Payer: Self-pay

## 2023-07-20 NOTE — Progress Notes (Deleted)
      Established patient visit   Patient: Ruben Sweeney   DOB: Aug 05, 1990   33 y.o. Male  MRN: [id number] Visit Date: 07/21/2023  Today's healthcare provider: Trenton Frock, PA-C   No chief complaint on file.  Subjective     ***  Medications: Outpatient Medications Prior to Visit  Medication Sig   cyanocobalamin  (VITAMIN B12) 1000 MCG tablet Take 1 tablet (1,000 mcg total) by mouth daily.   pantoprazole  (PROTONIX ) 40 MG tablet Take 1 tablet (40 mg total) by mouth daily. Take first thing in the morning and wait 20 minutes before eating any food   urea  (CARMOL) 40 % CREA Apply to both feet twice daily   No facility-administered medications prior to visit.    Review of Systems {Insert previous labs (optional):23779} {See past labs  Heme  Chem  Endocrine  Serology  Results Review (optional):1}   Objective    There were no vitals taken for this visit. {Insert last BP/Wt (optional):23777}{See vitals history (optional):1}  Physical Exam  ***  No results found for any visits on 07/21/23.  Assessment & Plan    There are no diagnoses linked to this encounter.  ***  No follow-ups on file.       Trenton Frock, PA-C  [facility] Primary Care at [facility] [phone] ([phone] (fax)  [facility]

## 2023-07-21 ENCOUNTER — Ambulatory Visit: Admitting: Physician Assistant

## 2023-07-23 ENCOUNTER — Encounter: Payer: Self-pay | Admitting: Physician Assistant

## 2023-07-23 ENCOUNTER — Other Ambulatory Visit (HOSPITAL_BASED_OUTPATIENT_CLINIC_OR_DEPARTMENT_OTHER): Payer: Self-pay

## 2023-07-23 ENCOUNTER — Ambulatory Visit: Admitting: Physician Assistant

## 2023-07-23 VITALS — BP 113/74 | HR 82 | Temp 97.8°F | Ht 65.5 in | Wt 207.6 lb

## 2023-07-23 DIAGNOSIS — J029 Acute pharyngitis, unspecified: Secondary | ICD-10-CM

## 2023-07-23 DIAGNOSIS — H6503 Acute serous otitis media, bilateral: Secondary | ICD-10-CM

## 2023-07-23 DIAGNOSIS — R051 Acute cough: Secondary | ICD-10-CM | POA: Diagnosis not present

## 2023-07-23 DIAGNOSIS — Z8659 Personal history of other mental and behavioral disorders: Secondary | ICD-10-CM | POA: Diagnosis not present

## 2023-07-23 LAB — POCT INFLUENZA A/B
Influenza A, POC: NEGATIVE
Influenza B, POC: NEGATIVE

## 2023-07-23 LAB — POC COVID19 BINAXNOW: SARS Coronavirus 2 Ag: NEGATIVE

## 2023-07-23 MED ORDER — PREDNISONE 20 MG PO TABS
20.0000 mg | ORAL_TABLET | Freq: Every day | ORAL | 0 refills | Status: DC
  Filled 2023-07-23: qty 5, 5d supply, fill #0

## 2023-07-23 MED ORDER — AZELASTINE HCL 0.1 % NA SOLN
1.0000 | Freq: Two times a day (BID) | NASAL | 1 refills | Status: DC
  Filled 2023-07-23: qty 30, 30d supply, fill #0
  Filled 2023-08-19: qty 30, 30d supply, fill #1

## 2023-07-23 NOTE — Progress Notes (Signed)
 Established patient visit   Patient: Ruben Sweeney   DOB: [birth year]   33 y.o. Male  MRN: [id number] Visit Date: 07/23/2023  Today's healthcare provider: Trenton Frock, PA-C   Cc. Uri symptoms  Subjective     Pt reports cough, congestion, ear pain, x 2 days taking zyrtec over the counter . Reports his ears hurt, feels like he is underwater.   He also reports a history of adhd, he used to take medicine in high school. Reports when he doesn't take medicine he loses his job, recently was let go. Last prescribed medicine was high school. Unsure who did his eval in high school.  Medications: Outpatient Medications Prior to Visit  Medication Sig   cyanocobalamin  (VITAMIN B12) 1000 MCG tablet Take 1 tablet (1,000 mcg total) by mouth daily.   pantoprazole  (PROTONIX ) 40 MG tablet Take 1 tablet (40 mg total) by mouth daily. Take first thing in the morning and wait 20 minutes before eating any food   urea  (CARMOL) 40 % CREA Apply to both feet twice daily   No facility-administered medications prior to visit.    Review of Systems  Constitutional:  Positive for fatigue. Negative for fever.  HENT:  Positive for congestion, ear pain and sinus pressure.   Respiratory:  Negative for cough and shortness of breath.   Cardiovascular:  Negative for chest pain, palpitations and leg swelling.  Neurological:  Negative for dizziness and headaches.       Objective    BP 113/74   Pulse 82   Temp 97.8 F (36.6 C)   Ht 5' 5.5" (1.664 m)   Wt 207 lb 9.6 oz (94.2 kg)   SpO2 98%   BMI 34.02 kg/m    Physical Exam Constitutional:      General: He is awake.     Appearance: He is well-developed.  HENT:     Head: Normocephalic.     Ears:     Comments: B/l serous effusion, mild tm bulging, no erythema or injection Eyes:     Conjunctiva/sclera: Conjunctivae normal.  Cardiovascular:     Rate and Rhythm: Normal rate and regular rhythm.     Heart sounds: Normal heart sounds.   Pulmonary:     Effort: Pulmonary effort is normal.     Breath sounds: Normal breath sounds.  Skin:    General: Skin is warm.  Neurological:     Mental Status: He is alert and oriented to person, place, and time.  Psychiatric:        Attention and Perception: Attention normal.        Mood and Affect: Mood normal.        Speech: Speech normal.        Behavior: Behavior is cooperative.     Results for orders placed or performed in visit on 07/23/23  POC COVID-19 BinaxNow  Result Value Ref Range   SARS Coronavirus 2 Ag Negative Negative  POCT Influenza A/B  Result Value Ref Range   Influenza A, POC Negative Negative   Influenza B, POC Negative Negative    Assessment & Plan    Non-recurrent acute serous otitis media of both ears -     Azelastine HCl; Place 1 spray into both nostrils 2 (two) times daily. Use in each nostril as directed  Dispense: 30 mL; Refill: 1 -     predniSONE; Take 1 tablet (20 mg total) by mouth daily with breakfast.  Dispense: 5 tablet; Refill: 0  Acute  cough -     POC COVID-19 BinaxNow -     POCT Influenza A/B  Sore throat -     POC COVID-19 BinaxNow -     POCT Influenza A/B  History of ADHD -     Ambulatory referral to Psychiatry   Recommending another eval for ADHD given distant history and no record on chart.  Return if symptoms worsen or fail to improve.       Trenton Frock, PA-C  [facility] at [facility] [phone] (phone) [phone] (fax)  [facility]

## 2023-07-26 ENCOUNTER — Other Ambulatory Visit: Payer: Self-pay

## 2023-07-26 ENCOUNTER — Other Ambulatory Visit (HOSPITAL_COMMUNITY): Payer: Self-pay

## 2023-07-26 ENCOUNTER — Other Ambulatory Visit (HOSPITAL_BASED_OUTPATIENT_CLINIC_OR_DEPARTMENT_OTHER): Payer: Self-pay

## 2023-08-05 ENCOUNTER — Encounter: Payer: Self-pay | Admitting: Physician Assistant

## 2023-08-05 ENCOUNTER — Ambulatory Visit: Admitting: Physician Assistant

## 2023-08-05 VITALS — BP 124/86 | HR 93 | Temp 98.1°F | Ht 65.5 in | Wt 204.8 lb

## 2023-08-05 DIAGNOSIS — L918 Other hypertrophic disorders of the skin: Secondary | ICD-10-CM

## 2023-08-05 DIAGNOSIS — L980 Pyogenic granuloma: Secondary | ICD-10-CM | POA: Diagnosis not present

## 2023-08-05 NOTE — Progress Notes (Signed)
 Established patient visit   Patient: Ruben Sweeney   DOB: 06-27-1990   32 y.o. Male  MRN: [id number] Visit Date: 08/05/2023  Today's healthcare provider: Trenton Frock, PA-C   Chief Complaint  Patient presents with   Skin Tag    Onset ' a couple of days ago, I scratched it and now it hurts a lot more, I can't even lay on my back.    Subjective     Pt reports a few days ago he scratched a lesion on his back and it hurts and started bleeding.  Medications: Outpatient Medications Prior to Visit  Medication Sig   azelastine  (ASTELIN ) 0.1 % nasal spray Place 1 spray into both nostrils 2 (two) times daily. Use in each nostril as directed   cyanocobalamin  (VITAMIN B12) 1000 MCG tablet Take 1 tablet (1,000 mcg total) by mouth daily.   pantoprazole  (PROTONIX ) 40 MG tablet Take 1 tablet (40 mg total) by mouth daily. Take first thing in the morning and wait 20 minutes before eating any food   predniSONE  (DELTASONE ) 20 MG tablet Take 1 tablet (20 mg total) by mouth daily with breakfast.   urea  (CARMOL) 40 % CREA Apply to both feet twice daily   No facility-administered medications prior to visit.    Review of Systems  Constitutional:  Negative for fatigue and fever.  Respiratory:  Negative for cough and shortness of breath.   Cardiovascular:  Negative for chest pain, palpitations and leg swelling.  Skin:  Positive for wound.  Neurological:  Negative for dizziness and headaches.       Objective    BP 124/86 (BP Location: Right Arm, Patient Position: Sitting, Cuff Size: Normal)   Pulse 93   Temp 98.1 F (36.7 C) (Oral)   Ht 5' 5.5" (1.664 m)   Wt 204 lb 12.8 oz (92.9 kg)   SpO2 96%   BMI 33.56 kg/m    Physical Exam Vitals reviewed.  Constitutional:      Appearance: He is not ill-appearing.  HENT:     Head: Normocephalic.  Eyes:     Conjunctiva/sclera: Conjunctivae normal.  Cardiovascular:     Rate and Rhythm: Normal rate.  Pulmonary:     Effort: Pulmonary  effort is normal. No respiratory distress.  Skin:    Comments: Center of upper back a 4 mm pedunculated lesion with dried blood. No surrounding erythema.  Neurological:     Mental Status: He is alert and oriented to person, place, and time.  Psychiatric:        Mood and Affect: Mood normal.        Behavior: Behavior normal.     No results found for any visits on 08/05/23.  Assessment & Plan    Skin tag -     Surgical pathology -     Skin excision  Pt had scratched skin tag, partially removed. Reviewed options to full remove in office vs f/u with dermatology.  Pt consented to removal today.  Sending out sample for path, but appears b9.  Skin excision  Date/Time: 08/05/2023 2:53 PM  Performed by: Trenton Frock, PA-C Authorized by: Trenton Frock, PA-C   Number of Lesions: 1 Lesion 1:    Body area: trunk   Trunk location: back   Initial size (mm): 4   Final defect size (mm): 4   Malignancy: benign lesion     Destruction method: curettage     Repair comments: Center of upper back a 4 mm  pedunculated lesion with dried blood. No surrounding erythema. Verbal consent was obtained, area was prepped with betadine, 2 cc of 3% lido with epi was used to numb the area . A 13 blade was used to cut the remaining stalk off the lesion, pressure was applied.  Silver nitrite was used to cauterize, hemostasis was obtained.   Area was dressed. Pt was advised to keep clean, dry. Warned of what signs of infection look like.    Return if symptoms worsen or fail to improve.       Trenton Frock, PA-C  [facility] Primary Care at [facility] [phone] (phone) [phone] (fax)  [facility]

## 2023-08-06 ENCOUNTER — Other Ambulatory Visit (HOSPITAL_BASED_OUTPATIENT_CLINIC_OR_DEPARTMENT_OTHER): Payer: Self-pay

## 2023-08-09 ENCOUNTER — Encounter: Payer: Self-pay | Admitting: Physician Assistant

## 2023-08-10 LAB — SURGICAL PATHOLOGY

## 2023-08-11 ENCOUNTER — Ambulatory Visit: Payer: Self-pay | Admitting: Physician Assistant

## 2023-08-19 ENCOUNTER — Other Ambulatory Visit (HOSPITAL_COMMUNITY): Payer: Self-pay

## 2023-08-19 ENCOUNTER — Other Ambulatory Visit: Payer: Self-pay

## 2023-08-20 ENCOUNTER — Other Ambulatory Visit

## 2023-09-14 ENCOUNTER — Other Ambulatory Visit (HOSPITAL_COMMUNITY): Payer: Self-pay

## 2023-09-14 ENCOUNTER — Other Ambulatory Visit: Payer: Self-pay

## 2023-09-15 ENCOUNTER — Other Ambulatory Visit: Payer: Self-pay | Admitting: Physician Assistant

## 2023-09-15 ENCOUNTER — Other Ambulatory Visit: Payer: Self-pay

## 2023-09-15 ENCOUNTER — Other Ambulatory Visit (HOSPITAL_BASED_OUTPATIENT_CLINIC_OR_DEPARTMENT_OTHER): Payer: Self-pay

## 2023-09-15 DIAGNOSIS — L84 Corns and callosities: Secondary | ICD-10-CM

## 2023-09-15 DIAGNOSIS — L853 Xerosis cutis: Secondary | ICD-10-CM

## 2023-09-15 MED ORDER — UREA 40 % EX CREA
TOPICAL_CREAM | CUTANEOUS | 1 refills | Status: AC
  Filled 2023-09-15: qty 85, 30d supply, fill #0

## 2023-09-16 ENCOUNTER — Other Ambulatory Visit (HOSPITAL_BASED_OUTPATIENT_CLINIC_OR_DEPARTMENT_OTHER): Payer: Self-pay

## 2023-09-24 DIAGNOSIS — M778 Other enthesopathies, not elsewhere classified: Secondary | ICD-10-CM | POA: Diagnosis not present

## 2023-09-28 ENCOUNTER — Other Ambulatory Visit (HOSPITAL_BASED_OUTPATIENT_CLINIC_OR_DEPARTMENT_OTHER): Payer: Self-pay

## 2023-10-08 ENCOUNTER — Emergency Department (HOSPITAL_BASED_OUTPATIENT_CLINIC_OR_DEPARTMENT_OTHER)
Admission: EM | Admit: 2023-10-08 | Discharge: 2023-10-08 | Disposition: A | Discharge status: Home / Self Care | Attending: Emergency Medicine | Admitting: Emergency Medicine

## 2023-10-08 ENCOUNTER — Encounter (HOSPITAL_BASED_OUTPATIENT_CLINIC_OR_DEPARTMENT_OTHER): Payer: Self-pay

## 2023-10-08 ENCOUNTER — Other Ambulatory Visit: Payer: Self-pay

## 2023-10-08 DIAGNOSIS — R1031 Right lower quadrant pain: Secondary | ICD-10-CM | POA: Insufficient documentation

## 2023-10-08 DIAGNOSIS — R319 Hematuria, unspecified: Secondary | ICD-10-CM | POA: Diagnosis not present

## 2023-10-08 DIAGNOSIS — R109 Unspecified abdominal pain: Secondary | ICD-10-CM

## 2023-10-08 DIAGNOSIS — Z87442 Personal history of urinary calculi: Secondary | ICD-10-CM | POA: Insufficient documentation

## 2023-10-08 DIAGNOSIS — N200 Calculus of kidney: Secondary | ICD-10-CM | POA: Diagnosis not present

## 2023-10-08 LAB — URINALYSIS, MICROSCOPIC (REFLEX): RBC / HPF: >50 RBC/hpf (ref 0–5)

## 2023-10-08 LAB — URINALYSIS, ROUTINE W REFLEX MICROSCOPIC
Glucose, UA: NEGATIVE mg/dL
Ketones, ur: NEGATIVE mg/dL
Leukocytes,Ua: NEGATIVE
Nitrite: NEGATIVE
Protein, ur: 100 mg/dL — AB
Specific Gravity, Urine: 1.02 (ref 1.005–1.030)
pH: 5.5 (ref 5.0–8.0)

## 2023-10-08 LAB — CBC
HCT: 40.1 % (ref 39.0–52.0)
Hemoglobin: 14.2 g/dL (ref 13.0–17.0)
MCH: 31.9 pg (ref 26.0–34.0)
MCHC: 35.4 g/dL (ref 30.0–36.0)
MCV: 90.1 fL (ref 80.0–100.0)
Platelets: 298 K/uL (ref 150–400)
RBC: 4.45 MIL/uL (ref 4.22–5.81)
RDW: 12.9 % (ref 11.5–15.5)
WBC: 10.5 K/uL (ref 4.0–10.5)
nRBC: 0 % (ref 0.0–0.2)

## 2023-10-08 LAB — COMPREHENSIVE METABOLIC PANEL WITH GFR
ALT: 79 U/L — ABNORMAL HIGH (ref 0–44)
AST: 30 U/L (ref 15–41)
Albumin: 4.2 g/dL (ref 3.5–5.0)
Alkaline Phosphatase: 64 U/L (ref 38–126)
Anion gap: 14 (ref 5–15)
BUN: 13 mg/dL (ref 6–20)
CO2: 23 mmol/L (ref 22–32)
Calcium: 9.4 mg/dL (ref 8.9–10.3)
Chloride: 104 mmol/L (ref 98–111)
Creatinine, Ser: 1.03 mg/dL (ref 0.61–1.24)
GFR, Estimated: >60 mL/min (ref >60)
Glucose, Bld: 116 mg/dL — ABNORMAL HIGH (ref 70–99)
Potassium: 3.6 mmol/L (ref 3.5–5.1)
Sodium: 141 mmol/L (ref 135–145)
Total Bilirubin: 0.6 mg/dL (ref 0.0–1.2)
Total Protein: 7.2 g/dL (ref 6.5–8.1)

## 2023-10-08 LAB — LIPASE, BLOOD: Lipase: 38 U/L (ref 11–51)

## 2023-10-08 MED ORDER — TAMSULOSIN HCL 0.4 MG PO CAPS: 0.4000 mg | ORAL_CAPSULE | Freq: Every day | ORAL | 0 refills | Status: AC | PRN

## 2023-10-08 MED ORDER — OXYCODONE-ACETAMINOPHEN 5-325 MG PO TABS
1.0000 | ORAL_TABLET | Freq: Four times a day (QID) | ORAL | 0 refills | Status: AC | PRN
Start: 2023-10-08 — End: ?

## 2023-10-08 MED ORDER — ONDANSETRON 4 MG PO TBDP: 4.0000 mg | ORAL_TABLET | Freq: Three times a day (TID) | ORAL | 0 refills | Status: AC | PRN

## 2023-10-08 NOTE — Discharge Instructions (Signed)
 Your history, exam, and evaluation today seem consistent with a recently passed kidney stone given the blood in your urine and your description of symptoms.  As your pain had resolved right upon arrival, we agreed together to get labs but hold on imaging today.  Please use the medicine if the symptoms return and if symptoms significantly return did not respond to medications, return to the nearest emergency department for likely imaging.  Please follow-up with urology and PCP.

## 2023-10-08 NOTE — ED Provider Notes (Signed)
 [place] [facility]PART[facility] AT [facility] Provider Note   CSN: [id number] Arrival date & time: 10/08/23  1035     Patient presents with: Abdominal Pain   Ruben Sweeney is a 33 y.o. male.   The history is provided by the patient and medical records. No language interpreter was used.  Abdominal Pain Pain location:  R flank, RLQ and suprapubic Pain quality: aching, cramping and sharp   Pain radiates to:  Does not radiate Pain severity:  Severe Onset quality:  Gradual Duration:  1 Sweeney Timing:  Constant Progression:  Resolved Chronicity:  Recurrent Context: not trauma   Relieved by:  Nothing Worsened by:  Nothing Ineffective treatments:  None tried Associated symptoms: hematuria, nausea and vomiting   Associated symptoms: no chest pain, no chills, no constipation, no cough, no diarrhea, no dysuria, no fatigue, no fever and no flatus        Prior to Admission medications   Medication Sig Start Date End Date Taking? Authorizing Provider  azelastine  (ASTELIN ) 0.1 % nasal spray Place 1 spray into both nostrils 2 (two) times daily. Use in each nostril as directed 07/23/23   Drubel, Manuelita, PA-C  cyanocobalamin  (VITAMIN B12) 1000 MCG tablet Take 1 tablet (1,000 mcg total) by mouth daily. 04/23/23   Cyndi Manuelita, PA-C  pantoprazole  (PROTONIX ) 40 MG tablet Take 1 tablet (40 mg total) by mouth daily. Take first thing in the morning and wait 20 minutes before eating any food 04/21/23   Drubel, Manuelita, PA-C  predniSONE  (DELTASONE ) 20 MG tablet Take 1 tablet (20 mg total) by mouth daily with breakfast. 07/23/23   Drubel, Manuelita, PA-C  urea  (CARMOL) 40 % CREA Apply to both feet twice daily 09/15/23   Drubel, Manuelita, PA-C    Allergies: Patient has no known allergies.    Review of Systems  Constitutional:  Negative for chills, fatigue and fever.  Respiratory:  Negative for cough and chest tightness.   Cardiovascular:  Negative for chest pain.  Gastrointestinal:   Positive for abdominal pain, nausea and vomiting. Negative for constipation, diarrhea and flatus.  Genitourinary:  Positive for flank pain and hematuria. Negative for decreased urine volume, dysuria and frequency.  Musculoskeletal:  Positive for back pain. Negative for neck pain and neck stiffness.  Skin:  Negative for rash.  Neurological:  Negative for headaches.  Psychiatric/Behavioral:  Negative for agitation.     Updated Vital Signs BP (!) 146/96 (BP Location: Right Arm)   Pulse 85   Temp 98.4 F (36.9 C) (Oral)   Resp 20   Ht 5' 5.5 (1.664 m)   Wt 103.4 kg   SpO2 100%   BMI 37.36 kg/m   Physical Exam Vitals and nursing note reviewed.  Constitutional:      General: He is not in acute distress.    Appearance: He is well-developed. He is not ill-appearing, toxic-appearing or diaphoretic.  HENT:     Head: Normocephalic and atraumatic.  Eyes:     Conjunctiva/sclera: Conjunctivae normal.  Cardiovascular:     Rate and Rhythm: Normal rate and regular rhythm.     Heart sounds: No murmur heard. Pulmonary:     Effort: Pulmonary effort is normal. No respiratory distress.     Breath sounds: Normal breath sounds. No wheezing, rhonchi or rales.  Chest:     Chest wall: No tenderness.  Abdominal:     General: Abdomen is flat. Bowel sounds are normal.     Palpations: Abdomen is soft.  Tenderness: There is no abdominal tenderness. There is no right CVA tenderness, left CVA tenderness, guarding or rebound.  Musculoskeletal:        General: No swelling.     Cervical back: Neck supple.  Skin:    General: Skin is warm and dry.     Capillary Refill: Capillary refill takes less than 2 seconds.     Coloration: Skin is not pale.     Findings: No rash.  Neurological:     Mental Status: He is alert.  Psychiatric:        Mood and Affect: Mood normal.     (all labs ordered are listed, but only abnormal results are displayed) Labs Reviewed  COMPREHENSIVE METABOLIC PANEL WITH GFR -  Abnormal; Notable for the following components:      Result Value   Glucose, Bld 116 (*)    ALT 79 (*)    All other components within normal limits  URINALYSIS, ROUTINE W REFLEX MICROSCOPIC - Abnormal; Notable for the following components:   Color, Urine AMBER (*)    APPearance CLOUDY (*)    Hgb urine dipstick LARGE (*)    Bilirubin Urine SMALL (*)    Protein, ur 100 (*)    All other components within normal limits  URINALYSIS, MICROSCOPIC (REFLEX) - Abnormal; Notable for the following components:   Bacteria, UA MANY (*)    All other components within normal limits  LIPASE, BLOOD  CBC    EKG: None  Radiology: No results found.   Procedures   Medications Ordered in the ED - No data to display                                  Medical Decision Making Amount and/or Complexity of Data Reviewed Labs: ordered.  Risk Prescription drug management.    Ruben Sweeney is a 33 y.o. male with a past medical history significant for GERD, hepatic steatosis, hypertension, and previous kidney stones who presents with right back and flank pain going to abdominal pain with nausea and vomiting today.  He reports it feels similar to previous kidney stone with sharp and aching pain.  Reports his urine has been darker but no dysuria.  No fevers or chills.  He reports that started last night and throughout the night and this morning and by the time he got here it has nearly resolved.  He reports feeling great now.  Denies any constipation or diarrhea or bowel changes.  Denies any trauma.  Denies rash to suggest shingles.  He says he thinks he passed a stone.  On exam, lungs clear and chest nontender.  Abdomen completely nontender.  Good bowel sounds.  Flank and CVA area is nontender.  Patient denies any testicle pains.  Exam otherwise reassuring.  We did shared decision-making conversation and agreed to get urinalysis and labs will hold on imaging at this time as he thinks he already passed a  stone he does not want to get a radiated or charge for imaging if he is already passed.  Will reassess shortly and if he is doing better, anticipate possible discharge with some medications to help if he still has residual suspected kidney stone pain and hold on imaging.  Patient knows that if symptoms return he will likely to return to get imaging to make sure he does not have a complicated stone with obstruction or something like a appendicitis or cholecystitis.  Anticipate reassessment.  Patient reports he still has no symptoms.  Workup returned showing hematuria but no nitrites or leukocytes to suggest infection or infected stone.  He thinks he has passed the stone and still does not want any medications or imaging today.  Will give prescription for some medication to help with the is for residual pains from the suspected kidney stone but I feel he is safe for discharge home.  Will follow-up with PCP and urology and understood return precautions.  He denied questions or concerns and was discharged in good condition.      Final diagnoses:  Right flank pain  Hematuria, unspecified type  History of kidney stones    ED Discharge Orders          Ordered    oxyCODONE-acetaminophen (PERCOCET/ROXICET) 5-325 MG tablet  Every 6 hours PRN        10/08/23 1315    ondansetron  (ZOFRAN -ODT) 4 MG disintegrating tablet  Every 8 hours PRN        10/08/23 1315    tamsulosin (FLOMAX) 0.4 MG CAPS capsule  Daily PRN        10/08/23 1315            Clinical Impression: 1. Right flank pain   2. Hematuria, unspecified type   3. History of kidney stones     Disposition: Discharge  Condition: Good  I have discussed the results, Dx and Tx plan with the pt(& family if present). He/she/they expressed understanding and agree(s) with the plan. Discharge instructions discussed at great length. Strict return precautions discussed and pt &/or family have verbalized understanding of the instructions. No  further questions at time of discharge.    New Prescriptions   ONDANSETRON  (ZOFRAN -ODT) 4 MG DISINTEGRATING TABLET    Take 1 tablet (4 mg total) by mouth every 8 (eight) hours as needed for nausea or vomiting.   OXYCODONE-ACETAMINOPHEN (PERCOCET/ROXICET) 5-325 MG TABLET    Take 1 tablet by mouth every 6 (six) hours as needed for severe pain (pain score 7-10).   TAMSULOSIN (FLOMAX) 0.4 MG CAPS CAPSULE    Take 1 capsule (0.4 mg total) by mouth daily as needed for up to 7 days (kidney stone pain).    Follow Up: Cyndi Shaver, PA-C [address] [place] [address] [phone]     [facility] [address] [place] [address] [place]  [address] [phone]        Sharian Delia, Lonni PARAS, MD 10/08/23 [phone]

## 2023-10-08 NOTE — ED Triage Notes (Signed)
 Arrives POV with complaints of lower abdominal pain, back pain, and vomiting that started today.  Patient does report a hx of kidney stones as well. Rates pain a 5/10.

## 2023-11-14 ENCOUNTER — Other Ambulatory Visit: Payer: Self-pay

## 2023-11-14 ENCOUNTER — Emergency Department (HOSPITAL_BASED_OUTPATIENT_CLINIC_OR_DEPARTMENT_OTHER)
Admission: EM | Admit: 2023-11-14 | Discharge: 2023-11-14 | Disposition: A | Discharge status: Home / Self Care | Attending: Emergency Medicine | Admitting: Emergency Medicine

## 2023-11-14 ENCOUNTER — Encounter (HOSPITAL_BASED_OUTPATIENT_CLINIC_OR_DEPARTMENT_OTHER): Payer: Self-pay

## 2023-11-14 DIAGNOSIS — R1032 Left lower quadrant pain: Secondary | ICD-10-CM | POA: Diagnosis not present

## 2023-11-14 DIAGNOSIS — K921 Melena: Secondary | ICD-10-CM | POA: Diagnosis not present

## 2023-11-14 DIAGNOSIS — R1031 Right lower quadrant pain: Secondary | ICD-10-CM | POA: Insufficient documentation

## 2023-11-14 DIAGNOSIS — R1030 Lower abdominal pain, unspecified: Secondary | ICD-10-CM

## 2023-11-14 HISTORY — DX: Calculus of kidney: N20.0

## 2023-11-14 LAB — CBC
HCT: 42.9 % (ref 39.0–52.0)
Hemoglobin: 14.8 g/dL (ref 13.0–17.0)
MCH: 31.9 pg (ref 26.0–34.0)
MCHC: 34.5 g/dL (ref 30.0–36.0)
MCV: 92.5 fL (ref 80.0–100.0)
Platelets: 263 K/uL (ref 150–400)
RBC: 4.64 MIL/uL (ref 4.22–5.81)
RDW: 13.4 % (ref 11.5–15.5)
WBC: 9.4 K/uL (ref 4.0–10.5)
nRBC: 0 % (ref 0.0–0.2)

## 2023-11-14 LAB — OCCULT BLOOD X 1 CARD TO LAB, STOOL: Fecal Occult Bld: NEGATIVE

## 2023-11-14 LAB — URINALYSIS, ROUTINE W REFLEX MICROSCOPIC
Bilirubin Urine: NEGATIVE
Glucose, UA: NEGATIVE mg/dL
Hgb urine dipstick: NEGATIVE
Ketones, ur: NEGATIVE mg/dL
Leukocytes,Ua: NEGATIVE
Nitrite: NEGATIVE
Protein, ur: NEGATIVE mg/dL
Specific Gravity, Urine: >=1.03 (ref 1.005–1.030)
pH: 5.5 (ref 5.0–8.0)

## 2023-11-14 LAB — COMPREHENSIVE METABOLIC PANEL WITH GFR
ALT: 58 U/L — ABNORMAL HIGH (ref 0–44)
AST: 26 U/L (ref 15–41)
Albumin: 4.7 g/dL (ref 3.5–5.0)
Alkaline Phosphatase: 69 U/L (ref 38–126)
Anion gap: 12 (ref 5–15)
BUN: 12 mg/dL (ref 6–20)
CO2: 22 mmol/L (ref 22–32)
Calcium: 9.4 mg/dL (ref 8.9–10.3)
Chloride: 107 mmol/L (ref 98–111)
Creatinine, Ser: 0.85 mg/dL (ref 0.61–1.24)
GFR, Estimated: >60 mL/min (ref >60)
Glucose, Bld: 96 mg/dL (ref 70–99)
Potassium: 4.1 mmol/L (ref 3.5–5.1)
Sodium: 141 mmol/L (ref 135–145)
Total Bilirubin: 0.9 mg/dL (ref 0.0–1.2)
Total Protein: 7.4 g/dL (ref 6.5–8.1)

## 2023-11-14 LAB — LIPASE, BLOOD: Lipase: 23 U/L (ref 11–51)

## 2023-11-14 NOTE — ED Provider Notes (Signed)
 Ruben Sweeney Provider Note   CSN: [id number] Arrival date & time: 11/14/23  9065     Patient presents with: Abdominal Pain and Blood In Stools   Ruben Sweeney is a 33 y.o. male who presents to the ED today with complaint of noticed scant blood in the stool starting yesterday evening, describes it as small spots of blood when defecating yesterday afternoon, was having normal stool, also noticed small volumes of blood on the toilet paper when cleaning himself after.  Denies having any rectal pain, did begin to have lower abdominal discomfort this morning, endorses normal appetite but has not eaten secondary to the lower abdominal pain and desire to be evaluated for this.  Review of previous medical records does show a history of GERD for which he has previously been prescribed pantoprazole , though he states he does not take this.    Abdominal Pain      Prior to Admission medications   Medication Sig Start Date End Date Taking? Authorizing Provider  azelastine  (ASTELIN ) 0.1 % nasal spray Place 1 spray into both nostrils 2 (two) times daily. Use in each nostril as directed 07/23/23   Drubel, Manuelita, PA-C  cyanocobalamin  (VITAMIN B12) 1000 MCG tablet Take 1 tablet (1,000 mcg total) by mouth daily. 04/23/23   Drubel, Manuelita, PA-C  ondansetron  (ZOFRAN -ODT) 4 MG disintegrating tablet Take 1 tablet (4 mg total) by mouth every 8 (eight) hours as needed for nausea or vomiting. 10/08/23   Tegeler, Lonni JINNY, MD  oxyCODONE -acetaminophen  (PERCOCET/ROXICET) 5-325 MG tablet Take 1 tablet by mouth every 6 (six) hours as needed for severe pain (pain score 7-10). 10/08/23   Tegeler, Lonni JINNY, MD  pantoprazole  (PROTONIX ) 40 MG tablet Take 1 tablet (40 mg total) by mouth daily. Take first thing in the morning and wait 20 minutes before eating any food 04/21/23   Drubel, Manuelita, PA-C  predniSONE  (DELTASONE ) 20 MG tablet Take 1 tablet (20 mg total) by mouth  daily with breakfast. 07/23/23   Drubel, Manuelita, PA-C  urea  (CARMOL) 40 % CREA Apply to both feet twice daily 09/15/23   Drubel, Manuelita, PA-C    Allergies: Patient has no known allergies.    Review of Systems  Gastrointestinal:  Positive for abdominal pain and blood in stool.  All other systems reviewed and are negative.   Updated Vital Signs BP (!) 122/91 (BP Location: Right Arm)   Pulse 84   Temp 98.7 F (37.1 C) (Oral)   Resp 15   Wt 92.1 kg   SpO2 97%   BMI 33.27 kg/m   Physical Exam Vitals and nursing note reviewed.  Constitutional:      General: He is not in acute distress.    Appearance: He is well-developed.  HENT:     Head: Normocephalic and atraumatic.  Eyes:     Conjunctiva/sclera: Conjunctivae normal.  Cardiovascular:     Rate and Rhythm: Normal rate and regular rhythm.     Heart sounds: No murmur heard. Pulmonary:     Effort: Pulmonary effort is normal. No respiratory distress.     Breath sounds: Normal breath sounds.  Abdominal:     General: Abdomen is flat. Bowel sounds are normal. There is no distension. There are no signs of injury.     Palpations: Abdomen is soft.     Tenderness: There is abdominal tenderness in the right lower quadrant and left lower quadrant.  Genitourinary:    Prostate: Normal.  Rectum: Normal.  Musculoskeletal:        General: No swelling.     Cervical back: Neck supple.  Skin:    General: Skin is warm and dry.     Capillary Refill: Capillary refill takes less than 2 seconds.  Neurological:     General: No focal deficit present.     Mental Status: He is alert.  Psychiatric:        Mood and Affect: Mood normal.     (all labs ordered are listed, but only abnormal results are displayed) Labs Reviewed  COMPREHENSIVE METABOLIC PANEL WITH GFR - Abnormal; Notable for the following components:      Result Value   ALT 58 (*)    All other components within normal limits  URINALYSIS, ROUTINE W REFLEX MICROSCOPIC - Abnormal;  Notable for the following components:   APPearance HAZY (*)    All other components within normal limits  LIPASE, BLOOD  CBC  OCCULT BLOOD X 1 CARD TO LAB, STOOL    EKG: None  Radiology: No results found.   Procedures   Medications Ordered in the ED - No data to display                                  Medical Decision Making Amount and/or Complexity of Data Reviewed Labs: ordered.   Medical Decision Making:   Ruben Sweeney is a 33 y.o. male who presented to the ED today with lower abdominal pain as well as blood in the stool.  Detailed above.     Complete initial physical exam performed, notably the patient  was alert and oriented in no apparent distress.  Only notable exam finding was of mild Sweeney tenderness to the left and right lower quadrants, negative Rovsing's sign.    Reviewed and confirmed nursing documentation for past medical history, family history, social history.    Initial Assessment:   With the patient's presentation of lower abdominal discomfort as well as as scant blood in the stools, consider possible internal hemorrhoid, consider further possible differential of diverticulosis/diverticulitis, inflammatory bowel disease, gastroenteritis.  Initial Plan:  Obtain Hemoccult to assess for presence of blood in the stool. Screening labs including CBC and Metabolic panel to evaluate for infectious or metabolic etiology of disease.  Add serum lipase to evaluate for possible pancreatic etiology of abdominal pain. Urinalysis with reflex culture ordered to evaluate for UTI or relevant urologic/nephrologic pathology.  Based on Hemoccult, consider further imaging of the abdomen. Objective evaluation as below reviewed   Initial Study Results:   Laboratory  All laboratory results reviewed without evidence of clinically relevant pathology.   Exceptions include: None  Reassessment and Plan:   Assessment of the lab workup obtained does not show any abnormalities,  urinalysis is unremarkable, Hemoccult is negative, physical exam did not show any internal or external hemorrhoids, lab evaluation of the liver enzymes does not show any concerning elevations, lipase is within normal limits.  As such, likely symptoms secondary to a unspecified gastroenteritis, discussed with patient importance of oral hydration, symptomatic management.  As he has remained stable, vital signs within normal limits, afebrile, and lab and physical evaluation is unremarkable at this time, feel he is stable for discharge and outpatient follow-up at this time.  Encouraged outpatient follow-up to primary care, patient understands and agrees has no further concerns at this time.       Final diagnoses:  Lower  abdominal pain    ED Discharge Orders     None          Myriam Dorn BROCKS, [place] 11/14/23 1144    Charlyn Sora, MD 11/14/23 1344

## 2023-11-14 NOTE — ED Triage Notes (Signed)
 Pt reports blood in stool last night. Described as spots of blood. Lower abdominal pain began this morning. Denies N/V. Stools are normal loose

## 2023-11-18 ENCOUNTER — Ambulatory Visit: Payer: Self-pay | Admitting: Medical

## 2023-11-18 ENCOUNTER — Ambulatory Visit (INDEPENDENT_AMBULATORY_CARE_PROVIDER_SITE_OTHER): Admitting: Medical

## 2023-11-18 VITALS — BP 120/82 | HR 84 | Temp 97.9°F | Resp 17 | Ht 65.5 in | Wt 195.4 lb

## 2023-11-18 DIAGNOSIS — K589 Irritable bowel syndrome without diarrhea: Secondary | ICD-10-CM

## 2023-11-18 DIAGNOSIS — K219 Gastro-esophageal reflux disease without esophagitis: Secondary | ICD-10-CM

## 2023-11-18 DIAGNOSIS — E538 Deficiency of other specified B group vitamins: Secondary | ICD-10-CM | POA: Diagnosis not present

## 2023-11-18 LAB — VITAMIN B12: Vitamin B-12: 175 pg/mL — ABNORMAL LOW (ref 211–911)

## 2023-11-18 MED ORDER — PANTOPRAZOLE SODIUM 40 MG PO TBEC: 40.0000 mg | DELAYED_RELEASE_TABLET | Freq: Every day | ORAL | 3 refills | Status: AC

## 2023-11-18 MED ORDER — FAMOTIDINE 20 MG PO TABS: 20.0000 mg | ORAL_TABLET | Freq: Every day | ORAL | 3 refills | Status: AC

## 2023-11-18 NOTE — Progress Notes (Unsigned)
 Subjective:    Patient ID: Ruben Sweeney, male    DOB: July 06, 1990, 33 y.o.   MRN: [id number]  HPI  Ruben Sweeney is a 33 year old male who presents with diarrhea and heartburn.  He has chronic gastrointestinal symptoms, including diarrhea and heartburn. Protonix  (pantoprazole ) 40 mg was initially effective for heartburn but has since lost efficacy. He has had heartburn and reflux since his early twenties, initially managed with Tums until Protonix  was prescribed at age 17. Despite dietary modifications, including avoiding fatty foods, fried foods, red meat, pork, acidic drinks, alcohol, and smoking, he continues to experience heartburn, even with salads.  He has a long-standing history of diarrhea, which he describes as a genetic issue, as his father also experiences it. He uses Imodium AD over-the-counter for management. His diarrhea has been consistent over the years, with occasional constipation. He reports that some days he has diarrhea and other days he is constipated. When he was taking vitamin B12 tablets for two months, his stools were less loose, but he discontinued them due to headaches.  He recently visited the hospital for diarrhea and noticed blood in his stool, although tests showed no blood at that time. A CBC and metabolic panel were performed, which showed a slightly elevated liver enzyme and normal pancreatic protein levels. He has not had an endoscopy despite the long history of reflux symptoms.  He maintains a healthy diet, avoiding alcohol and smoking. He has not had a repeat B12 level check since his initial low level of 131, and he has not returned for follow-up blood tests. No recent increase in stool frequency or changes in his baseline pattern of diarrhea and constipation.     Review of Systems  Constitutional:  Negative for chills, fatigue and fever.  HENT:  Negative for congestion and ear pain.   Respiratory:  Negative for cough, chest tightness and wheezing.    Cardiovascular:  Negative for chest pain and palpitations.  Gastrointestinal:  Positive for abdominal pain. Negative for abdominal distention, blood in stool, diarrhea and vomiting.       See hpi. 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    Past Medical History:  Diagnosis Date   HTN (hypertension)    Kidney stones      Social History   Socioeconomic History   Marital status: Married    Spouse name: Not on file   Number of children: Not on file   Years of education: Not on file   Highest education level: Some college, no degree  Occupational History   Not on file  Tobacco Use   Smoking status: Never   Smokeless tobacco: Never  Vaping Use   Vaping status: Some Days  Substance and Sexual Activity   Alcohol use: Yes    Comment: occasional   Drug use: Never   Sexual activity: Not on file  Other Topics Concern   Not on file  Social History Narrative   Not on file   Social Drivers of Health   Financial Resource Strain: Medium Risk (04/27/2023)   Overall Financial Resource Strain (CARDIA)    Difficulty of Paying Living Expenses: Somewhat hard  Food Insecurity: Food Insecurity Present (04/27/2023)   Hunger Vital Sign    Worried About Running Out of Food in the Last Year: Sometimes true    Ran Out of Food in the Last Year: Never true  Transportation Needs: No Transportation Needs (04/27/2023)   PRAPARE - Transportation  Lack of Transportation (Medical): No    Lack of Transportation (Non-Medical): No  Physical Activity: Insufficiently Active (04/27/2023)   Exercise Vital Sign    Days of Exercise per Week: 1 day    Minutes of Exercise per Session: 20 min  Stress: No Stress Concern Present (04/27/2023)   [employer] of Occupational Health - Occupational Stress Questionnaire    Feeling of Stress : Only a little  Social Connections: Socially Isolated (04/27/2023)   Social Connection and Isolation Panel    Frequency of Communication with Friends and  Family: Never    Frequency of Social Gatherings with Friends and Family: Never    Attends Religious Services: Never    Database administrator or Organizations: No    Attends Engineer, structural: Not on file    Marital Status: Married  Intimate Partner Violence: Unknown (08/04/2022)   Received from [facility]   HITS    Physically Hurt: Not on file    Insult or Talk Down To: Not on file    Threaten Physical Harm: Not on file    Scream or Curse: Not on file    No past surgical history on file.  Family History  Problem Relation Age of Onset   Heart attack Paternal Uncle    Hypertension Maternal Grandmother    Hypertension Maternal Grandfather    Stroke Maternal Grandfather     No Known Allergies  Current Outpatient Medications on File Prior to Visit  Medication Sig Dispense Refill   ondansetron  (ZOFRAN -ODT) 4 MG disintegrating tablet Take 1 tablet (4 mg total) by mouth every 8 (eight) hours as needed for nausea or vomiting. 20 tablet 0   oxyCODONE -acetaminophen  (PERCOCET/ROXICET) 5-325 MG tablet Take 1 tablet by mouth every 6 (six) hours as needed for severe pain (pain score 7-10). 15 tablet 0   urea  (CARMOL) 40 % CREA Apply to both feet twice daily 85 g 1   No current facility-administered medications on file prior to visit.    BP 120/82   Pulse 84   Temp 97.9 F (36.6 C) (Oral)   Resp 17   Ht 5' 5.5 (1.664 m)   Wt 195 lb 6.4 oz (88.6 kg)   SpO2 96%   BMI 32.02 kg/m          Objective:   Physical Exam   General Mental Status- Alert. General Appearance- Not in acute distress.   Skin General: Color- Normal Color. Moisture- Normal Moisture.  Neck  No JVD.  Chest and Lung Exam Auscultation: Breath Sounds:-Normal.  Cardiovascular Auscultation:Rythm- Regular. Murmurs & Other Heart Sounds:Auscultation of the heart reveals- No Murmurs.  Abdomen Inspection:-Inspeection Normal. Palpation/Percussion:Note:No mass. Palpation and Percussion of the  abdomen reveal- Non Tender, Non Distended + BS, no rebound or guarding.   Neurologic Cranial Nerve exam:- CN III-XII intact(No nystagmus), symmetric smile. Strength:- 5/5 equal and symmetric strength both upper and lower extremities.      Assessment & Plan:   Patient Instructions  Gastroesophageal reflux disease (GERD) refractory to proton pump inhibitor therapy GERD symptoms persist despite pantoprazole . Considered combination therapy with proton pump inhibitor and H2 blocker. Referred to gastroenterology for further evaluation due to long-standing symptoms. - Refer to gastroenterology for evaluation, including potential endoscopy. - Prescribe pantoprazole  40 mg in the morning and famotidine  20 mg at night.  Chronic diarrhea with alternating constipation (possible irritable bowel syndrome) Chronic diarrhea with alternating constipation suggests possible IBS. Family history noted. Considered dietary fiber supplementation. - Refer to gastroenterology for evaluation  of possible IBS. - Recommend Metamucil, one tablespoon in eight ounces of water, three times a day.  Vitamin B12 deficiency Vitamin B12 deficiency with low level of 131. Oral supplementation caused headaches. Parenteral supplementation considered due to low level. - Order vitamin B12 level.  Follow up date to be determined after lab review and for wellness exam(depending on who will be your pcp)

## 2023-11-18 NOTE — Patient Instructions (Signed)
 Gastroesophageal reflux disease (GERD) refractory to proton pump inhibitor therapy GERD symptoms persist despite pantoprazole . Considered combination therapy with proton pump inhibitor and H2 blocker. Referred to gastroenterology for further evaluation due to long-standing symptoms. - Refer to gastroenterology for evaluation, including potential endoscopy. - Prescribe pantoprazole  40 mg in the morning and famotidine  20 mg at night.  Chronic diarrhea with alternating constipation (possible irritable bowel syndrome) Chronic diarrhea with alternating constipation suggests possible IBS. Family history noted. Considered dietary fiber supplementation. - Refer to gastroenterology for evaluation of possible IBS. - Recommend Metamucil, one tablespoon in eight ounces of water, three times a day.  Vitamin B12 deficiency Vitamin B12 deficiency with low level of 131. Oral supplementation caused headaches. Parenteral supplementation considered due to low level. - Order vitamin B12 level.  Follow up date to be determined after lab review and for wellness exam(depending on who will be your pcp)

## 2023-11-24 ENCOUNTER — Ambulatory Visit (INDEPENDENT_AMBULATORY_CARE_PROVIDER_SITE_OTHER)

## 2023-11-24 DIAGNOSIS — E538 Deficiency of other specified B group vitamins: Secondary | ICD-10-CM | POA: Diagnosis not present

## 2023-11-24 MED ORDER — CYANOCOBALAMIN 1000 MCG/ML IJ SOLN: 1000.0000 ug | Freq: Once | INTRAMUSCULAR | Status: DC

## 2023-11-24 MED ORDER — CYANOCOBALAMIN 1000 MCG/ML IJ SOLN
1000.0000 ug | Freq: Once | INTRAMUSCULAR | Status: AC
  Administered 2023-11-24: 1000 ug via INTRAMUSCULAR

## 2023-11-24 NOTE — Progress Notes (Signed)
 Pt here for monthly B12 injection per [facility]  Last B12 injection: first one today  Last B12 level:  11/18/2023  B12 1000mcg given IM, and pt tolerated injection well.  Next B12 injection scheduled for: 12/02/2023

## 2023-12-02 ENCOUNTER — Ambulatory Visit (INDEPENDENT_AMBULATORY_CARE_PROVIDER_SITE_OTHER)

## 2023-12-02 DIAGNOSIS — E538 Deficiency of other specified B group vitamins: Secondary | ICD-10-CM

## 2023-12-02 MED ORDER — CYANOCOBALAMIN 1000 MCG/ML IJ SOLN
1000.0000 ug | Freq: Once | INTRAMUSCULAR | Status: AC
  Administered 2023-12-02: 1000 ug via INTRAMUSCULAR

## 2023-12-02 NOTE — Progress Notes (Signed)
 Pt here for weekly B12 injection # 2 of 4 per original order dated: 11/18/23 per pcp:  weekly b12 vitamin injection over next 4 weeks followed by monthly injection for 5 months   Last B12 injection: 11/24/23   Last B12 level:  175 on 11/18/2023   B12 given IM left deltoid, and pt tolerated injection well.  Next B12 injection scheduled for:  12/09/23.

## 2023-12-09 ENCOUNTER — Ambulatory Visit

## 2023-12-14 ENCOUNTER — Ambulatory Visit

## 2024-01-19 ENCOUNTER — Encounter: Admitting: Medical

## 2024-03-10 ENCOUNTER — Telehealth: Admitting: Family Medicine

## 2024-03-10 DIAGNOSIS — J208 Acute bronchitis due to other specified organisms: Secondary | ICD-10-CM

## 2024-03-10 MED ORDER — ALBUTEROL SULFATE HFA 108 (90 BASE) MCG/ACT IN AERS: 1.0000 | INHALATION_SPRAY | Freq: Four times a day (QID) | RESPIRATORY_TRACT | 0 refills | Status: DC | PRN

## 2024-03-10 MED ORDER — PSEUDOEPH-BROMPHEN-DM 30-2-10 MG/5ML PO SYRP: 5.0000 mL | ORAL_SOLUTION | Freq: Four times a day (QID) | ORAL | 0 refills | Status: AC | PRN

## 2024-03-10 MED ORDER — PREDNISONE 20 MG PO TABS: 40.0000 mg | ORAL_TABLET | Freq: Every day | ORAL | 0 refills | Status: AC

## 2024-03-10 NOTE — Progress Notes (Signed)
 We are sorry that you are not feeling well.  Here is how we plan to help!  Based on your presentation I believe you most likely have A cough due to a virus.  This is called viral bronchitis and is best treated by rest, plenty of fluids and control of the cough.  You may use Ibuprofen or Tylenol  as directed to help your symptoms.     In addition you may use a Prescription cough syrup called Bromfed DM cough syrup Take 5mL every 6 hours as needed for cough.  I am also prescribing: Albuterol inhaler Use 1-2 puffs every 6 hours as needed for shortness of breath, chest tightness, and/or wheezing. AND Prednisone  20mg  Take 2 tablets (40mg ) daily for 5 days.   From your responses in the eVisit questionnaire you describe inflammation in the upper respiratory tract which is causing a significant cough.  This is commonly called Bronchitis and has four common causes:   Allergies Viral Infections Acid Reflux Bacterial Infection Allergies, viruses and acid reflux are treated by controlling symptoms or eliminating the cause. An example might be a cough caused by taking certain blood pressure medications. You stop the cough by changing the medication. Another example might be a cough caused by acid reflux. Controlling the reflux helps control the cough.  USE OF BRONCHODILATOR (RESCUE) INHALERS: There is a risk from using your bronchodilator too frequently.  The risk is that over-reliance on a medication which only relaxes the muscles surrounding the breathing tubes can reduce the effectiveness of medications prescribed to reduce swelling and congestion of the tubes themselves.  Although you feel brief relief from the bronchodilator inhaler, your asthma may actually be worsening with the tubes becoming more swollen and filled with mucus.  This can delay other crucial treatments, such as oral steroid medications. If you need to use a bronchodilator inhaler daily, several times per day, you should discuss this  with your provider.  There are probably better treatments that could be used to keep your asthma under control.     HOME CARE Only take medications as instructed by your medical team. Complete the entire course of an antibiotic. Drink plenty of fluids and get plenty of rest. Avoid close contacts especially the very young and the elderly Cover your mouth if you cough or cough into your sleeve. Always remember to wash your hands A steam or ultrasonic humidifier can help congestion.   GET HELP RIGHT AWAY IF: You develop worsening fever. You become short of breath You cough up blood. Your symptoms persist after you have completed your treatment plan MAKE SURE YOU  Understand these instructions. Will watch your condition. Will get help right away if you are not doing well or get worse.  Your e-visit answers were reviewed by a board certified advanced clinical practitioner to complete your personal care plan.  Depending on the condition, your plan could have included both over the counter or prescription medications. If there is a problem please reply  once you have received a response from your provider. Your safety is important to us .  If you have drug allergies check your prescription carefully.    You can use MyChart to ask questions about todays visit, request a non-urgent call back, or ask for a work or school excuse for 24 hours related to this e-Visit. If it has been greater than 24 hours you will need to follow up with your provider, or enter a new e-Visit to address those concerns. You will get  an e-mail in the next two days asking about your experience.  I hope that your e-visit has been valuable and will speed your recovery. Thank you for using e-visits.   I have spent 5 minutes in review of e-visit questionnaire, review and updating patient chart, medical decision making and response to patient.   Delon CHRISTELLA Dickinson, PA-C

## 2024-03-11 MED ORDER — ALBUTEROL SULFATE HFA 108 (90 BASE) MCG/ACT IN AERS: 1.0000 | INHALATION_SPRAY | Freq: Four times a day (QID) | RESPIRATORY_TRACT | 0 refills | Status: AC | PRN

## 2024-03-11 NOTE — Addendum Note (Signed)
 Addended by: ALMEDA DEGREE on: 03/11/2024 08:19 AM   Modules accepted: Orders

## 2024-03-23 ENCOUNTER — Encounter: Admitting: Urology
